# Patient Record
Sex: Female | Born: 1935 | Race: White | Hispanic: No | Smoking: Never smoker
Health system: Southern US, Community
[De-identification: ages and names within clinical notes are randomized; demographics above are authoritative.]

## PROBLEM LIST (undated history)

## (undated) DIAGNOSIS — G20 Parkinson's disease: Secondary | ICD-10-CM

## (undated) DIAGNOSIS — G20A1 Parkinson's disease without dyskinesia, without mention of fluctuations: Secondary | ICD-10-CM

## (undated) DIAGNOSIS — I10 Essential (primary) hypertension: Secondary | ICD-10-CM

## (undated) DIAGNOSIS — F419 Anxiety disorder, unspecified: Secondary | ICD-10-CM

## (undated) HISTORY — PX: APPENDECTOMY: SHX54

## (undated) HISTORY — PX: ABDOMINAL HYSTERECTOMY: SHX81

## (undated) HISTORY — PX: ELBOW FRACTURE SURGERY: SHX616

## (undated) HISTORY — PX: PACEMAKER IMPLANT: EP1218

## (undated) HISTORY — PX: EYE SURGERY: SHX253

---

## 2018-03-02 DIAGNOSIS — R300 Dysuria: Secondary | ICD-10-CM | POA: Diagnosis not present

## 2018-03-02 DIAGNOSIS — N390 Urinary tract infection, site not specified: Secondary | ICD-10-CM | POA: Diagnosis not present

## 2018-03-28 DIAGNOSIS — Z950 Presence of cardiac pacemaker: Secondary | ICD-10-CM | POA: Diagnosis not present

## 2018-03-28 DIAGNOSIS — N390 Urinary tract infection, site not specified: Secondary | ICD-10-CM | POA: Diagnosis not present

## 2018-03-28 DIAGNOSIS — A499 Bacterial infection, unspecified: Secondary | ICD-10-CM | POA: Diagnosis not present

## 2018-03-28 DIAGNOSIS — R5383 Other fatigue: Secondary | ICD-10-CM | POA: Diagnosis not present

## 2018-03-28 DIAGNOSIS — R300 Dysuria: Secondary | ICD-10-CM | POA: Diagnosis not present

## 2018-03-28 DIAGNOSIS — R5381 Other malaise: Secondary | ICD-10-CM | POA: Diagnosis not present

## 2018-04-04 DIAGNOSIS — Z950 Presence of cardiac pacemaker: Secondary | ICD-10-CM | POA: Diagnosis not present

## 2018-04-04 DIAGNOSIS — I10 Essential (primary) hypertension: Secondary | ICD-10-CM | POA: Diagnosis not present

## 2018-04-17 DIAGNOSIS — Z950 Presence of cardiac pacemaker: Secondary | ICD-10-CM | POA: Diagnosis not present

## 2018-04-17 DIAGNOSIS — I10 Essential (primary) hypertension: Secondary | ICD-10-CM | POA: Diagnosis not present

## 2018-04-17 DIAGNOSIS — R0789 Other chest pain: Secondary | ICD-10-CM | POA: Diagnosis not present

## 2018-04-17 DIAGNOSIS — R079 Chest pain, unspecified: Secondary | ICD-10-CM | POA: Diagnosis not present

## 2018-05-01 DIAGNOSIS — Z7689 Persons encountering health services in other specified circumstances: Secondary | ICD-10-CM | POA: Diagnosis not present

## 2018-05-01 DIAGNOSIS — I10 Essential (primary) hypertension: Secondary | ICD-10-CM | POA: Diagnosis not present

## 2018-05-01 DIAGNOSIS — R5383 Other fatigue: Secondary | ICD-10-CM | POA: Diagnosis not present

## 2018-05-01 DIAGNOSIS — M810 Age-related osteoporosis without current pathological fracture: Secondary | ICD-10-CM | POA: Diagnosis not present

## 2018-05-01 DIAGNOSIS — R300 Dysuria: Secondary | ICD-10-CM | POA: Diagnosis not present

## 2018-05-01 DIAGNOSIS — R2689 Other abnormalities of gait and mobility: Secondary | ICD-10-CM | POA: Diagnosis not present

## 2018-05-01 DIAGNOSIS — Z8781 Personal history of (healed) traumatic fracture: Secondary | ICD-10-CM | POA: Diagnosis not present

## 2018-05-01 DIAGNOSIS — Z950 Presence of cardiac pacemaker: Secondary | ICD-10-CM | POA: Diagnosis not present

## 2018-05-01 DIAGNOSIS — G629 Polyneuropathy, unspecified: Secondary | ICD-10-CM | POA: Diagnosis not present

## 2018-05-01 DIAGNOSIS — R413 Other amnesia: Secondary | ICD-10-CM | POA: Diagnosis not present

## 2018-05-01 DIAGNOSIS — R251 Tremor, unspecified: Secondary | ICD-10-CM | POA: Diagnosis not present

## 2018-05-16 DIAGNOSIS — Z950 Presence of cardiac pacemaker: Secondary | ICD-10-CM | POA: Diagnosis not present

## 2018-05-16 DIAGNOSIS — R0789 Other chest pain: Secondary | ICD-10-CM | POA: Diagnosis not present

## 2018-05-16 DIAGNOSIS — I10 Essential (primary) hypertension: Secondary | ICD-10-CM | POA: Diagnosis not present

## 2018-05-31 DIAGNOSIS — R251 Tremor, unspecified: Secondary | ICD-10-CM | POA: Diagnosis not present

## 2018-05-31 DIAGNOSIS — R413 Other amnesia: Secondary | ICD-10-CM | POA: Diagnosis not present

## 2018-05-31 DIAGNOSIS — R2689 Other abnormalities of gait and mobility: Secondary | ICD-10-CM | POA: Diagnosis not present

## 2018-05-31 DIAGNOSIS — I10 Essential (primary) hypertension: Secondary | ICD-10-CM | POA: Diagnosis not present

## 2018-05-31 DIAGNOSIS — N390 Urinary tract infection, site not specified: Secondary | ICD-10-CM | POA: Diagnosis not present

## 2018-06-22 ENCOUNTER — Encounter: Payer: Self-pay | Admitting: Emergency Medicine

## 2018-06-22 ENCOUNTER — Other Ambulatory Visit: Payer: Self-pay

## 2018-06-22 ENCOUNTER — Emergency Department
Admission: EM | Admit: 2018-06-22 | Discharge: 2018-06-22 | Disposition: A | Discharge status: Home / Self Care | Payer: PPO | Attending: Student in an Organized Health Care Education/Training Program | Admitting: Student in an Organized Health Care Education/Training Program

## 2018-06-22 ENCOUNTER — Emergency Department: Payer: PPO

## 2018-06-22 DIAGNOSIS — Z950 Presence of cardiac pacemaker: Secondary | ICD-10-CM | POA: Diagnosis not present

## 2018-06-22 DIAGNOSIS — R079 Chest pain, unspecified: Secondary | ICD-10-CM | POA: Insufficient documentation

## 2018-06-22 DIAGNOSIS — J984 Other disorders of lung: Secondary | ICD-10-CM | POA: Diagnosis not present

## 2018-06-22 DIAGNOSIS — R0789 Other chest pain: Secondary | ICD-10-CM | POA: Diagnosis not present

## 2018-06-22 DIAGNOSIS — I10 Essential (primary) hypertension: Secondary | ICD-10-CM | POA: Insufficient documentation

## 2018-06-22 HISTORY — DX: Essential (primary) hypertension: I10

## 2018-06-22 LAB — BASIC METABOLIC PANEL
Anion gap: 9 (ref 5–15)
BUN: 17 mg/dL (ref 8–23)
CO2: 25 mmol/L (ref 22–32)
Calcium: 9.3 mg/dL (ref 8.9–10.3)
Chloride: 100 mmol/L (ref 98–111)
Creatinine, Ser: 0.62 mg/dL (ref 0.44–1.00)
GFR calc non Af Amer: >60 mL/min (ref >60)
Glucose, Bld: 102 mg/dL — ABNORMAL HIGH (ref 70–99)
Potassium: 3.8 mmol/L (ref 3.5–5.1)
Sodium: 134 mmol/L — ABNORMAL LOW (ref 135–145)

## 2018-06-22 LAB — CBC
HCT: 39.7 % (ref 36.0–46.0)
Hemoglobin: 13 g/dL (ref 12.0–15.0)
MCH: 30.1 pg (ref 26.0–34.0)
MCHC: 32.7 g/dL (ref 30.0–36.0)
MCV: 91.9 fL (ref 80.0–100.0)
PLATELETS: 202 10*3/uL (ref 150–400)
RBC: 4.32 MIL/uL (ref 3.87–5.11)
RDW: 12.7 % (ref 11.5–15.5)
WBC: 8.7 10*3/uL (ref 4.0–10.5)
nRBC: 0 % (ref 0.0–0.2)

## 2018-06-22 LAB — TROPONIN I
Troponin I: <0.03 ng/mL (ref <0.03)
Troponin I: <0.03 ng/mL (ref <0.03)

## 2018-06-22 MED ORDER — ASPIRIN 81 MG PO CHEW
324.0000 mg | CHEWABLE_TABLET | Freq: Once | ORAL | Status: AC
  Administered 2018-06-22: 324 mg via ORAL
  Filled 2018-06-22: qty 4

## 2018-06-22 MED ORDER — OXAZEPAM 15 MG PO CAPS: 10.0000 mg | ORAL_CAPSULE | Freq: Once | ORAL | Status: DC

## 2018-06-22 MED ORDER — LORAZEPAM 1 MG PO TABS
1.0000 mg | ORAL_TABLET | Freq: Once | ORAL | Status: AC
  Administered 2018-06-22: 1 mg via ORAL
  Filled 2018-06-22: qty 1

## 2018-06-22 MED ORDER — HYDRALAZINE HCL 10 MG PO TABS
20.0000 mg | ORAL_TABLET | Freq: Once | ORAL | Status: AC
  Administered 2018-06-22: 20 mg via ORAL
  Filled 2018-06-22 (×2): qty 2

## 2018-06-22 MED ORDER — NITROGLYCERIN 0.4 MG SL SUBL: 0.4000 mg | SUBLINGUAL_TABLET | SUBLINGUAL | Status: DC | PRN

## 2018-06-22 NOTE — ED Notes (Signed)
Pt assisted to toilet. Wet depends changed.

## 2018-06-22 NOTE — ED Triage Notes (Signed)
Pt to ED from home c/o left intermittent chest tightness that started this morning.  Denies SOB, states some nausea but no vomiting.  Pt chest rise even and unlabored, skin warm and dry, in NAD at this time.

## 2018-06-22 NOTE — Discharge Instructions (Signed)
Please follow-up with cardiology and your primary care physician.  Return immediately for any worsening symptoms for any questions or any concerns.

## 2018-06-22 NOTE — ED Notes (Signed)
Pt presents to ED from home with c/c of L side chest pressure beginning this AM. See triage note. Pt has cardiac Hx with A/V pacemaker implanted. Pt A&O x4, skin diaphoretic, Pt states that is normal for her. Pt resting comfortably.

## 2018-06-22 NOTE — ED Provider Notes (Signed)
[facility] Emergency Department Provider Note    First MD Initiated Contact with Patient 06/22/18 1610     (approximate)  I have reviewed the triage vital signs and the nursing notes.   HISTORY  Chief Complaint Chest Pain    HPI Kara Levine is a 84 y.o. female with a history of hypertension as well as pacemaker for tachybradycardia syndrome presents to the ER with midsternal squeezing chest pain and pressure that started roughly 30 to 45 minutes prior to arrival to the ER.  States that she was sitting down and just wrapping silverware.  States that she also felt sweaty.  Has not been able to tolerate recent stress test due to severe labile blood pressures stating that she will go from hypotension to severe hypertension intermittently.  She denies any chest pain or shortness of breath at this time.  No fevers.  States that she has being evaluated for Lewy body dementia.  Does have a history of anxiety but this feels very different.  Does not have any pain when she takes a deep breath.  No pain shooting or tearing through to her back.  Denies any nausea or vomiting at this time but did have some nausea associated with the pain.    Past Medical History:  Diagnosis Date  . Hypertension    History reviewed. No pertinent family history. Past Surgical History:  Procedure Laterality Date  . ABDOMINAL HYSTERECTOMY    . APPENDECTOMY    . ELBOW FRACTURE SURGERY Bilateral   . EYE SURGERY    . PACEMAKER IMPLANT     There are no active problems to display for this patient.     Prior to Admission medications   Medication Sig Start Date End Date Taking? Authorizing Provider  bisoprolol (ZEBETA) 5 MG tablet Take 5-10 mg by mouth 2 (two) times daily.    Yes [provider]  hydrALAZINE (APRESOLINE) 10 MG tablet Take 10-20 mg by mouth daily as needed (BP <160).   Yes [provider]  oxazepam (SERAX) 10 MG capsule Take 10 mg by mouth daily  as needed for anxiety (raised blood pressure).   Yes [provider]    Allergies Codeine; Keflex [cephalexin]; Norco [hydrocodone-acetaminophen]; and Shellfish allergy    Social History Social History   Tobacco Use  . Smoking status: Never Smoker  . Smokeless tobacco: Never Used  Substance Use Topics  . Alcohol use: Never    Frequency: Never  . Drug use: Never    Review of Systems Patient denies headaches, rhinorrhea, blurry vision, numbness, shortness of breath, chest pain, edema, cough, abdominal pain, nausea, vomiting, diarrhea, dysuria, fevers, rashes or hallucinations unless otherwise stated above in HPI. ____________________________________________   PHYSICAL EXAM:  VITAL SIGNS: Vitals:   06/22/18 1827 06/22/18 1930  BP: (!) 222/89 (!) 198/86  Pulse: 82 (!) 56  Resp: 15 14  Temp:    SpO2: 96% 99%    Constitutional: Alert and oriented.  Eyes: Conjunctivae are normal.  Head: Atraumatic. Nose: No congestion/rhinnorhea. Mouth/Throat: Mucous membranes are moist.   Neck: No stridor. Painless ROM.  Cardiovascular: Normal rate, regular rhythm. Grossly normal heart sounds.  Good peripheral circulation. Respiratory: Normal respiratory effort.  No retractions. Lungs CTAB. Gastrointestinal: Soft and nontender. No distention. No abdominal bruits. No CVA tenderness. Genitourinary:  Musculoskeletal: No lower extremity tenderness nor edema.  No joint effusions. Neurologic:  Normal speech and language. No gross focal neurologic deficits are appreciated. No facial droop Skin:  Skin is warm, dry and intact. No rash noted. Psychiatric: Mood and affect are normal. Speech and behavior are normal.  ____________________________________________   LABS (all labs ordered are listed, but only abnormal results are displayed)  Results for orders placed or performed during the hospital encounter of 06/22/18 (from the past 24 hour(s))  Basic metabolic panel     Status:  Abnormal   Collection Time: 06/22/18  1:21 PM  Result Value Ref Range   Sodium 134 (L) 135 - 145 mmol/L   Potassium 3.8 3.5 - 5.1 mmol/L   Chloride 100 98 - 111 mmol/L   CO2 25 22 - 32 mmol/L   Glucose, Bld 102 (H) 70 - 99 mg/dL   BUN 17 8 - 23 mg/dL   Creatinine, Ser 0.62 0.44 - 1.00 mg/dL   Calcium 9.3 8.9 - 10.3 mg/dL   GFR calc non Af Amer >60 >60 mL/min   GFR calc Af Amer >60 >60 mL/min   Anion gap 9 5 - 15  CBC     Status: None   Collection Time: 06/22/18  1:21 PM  Result Value Ref Range   WBC 8.7 4.0 - 10.5 K/uL   RBC 4.32 3.87 - 5.11 MIL/uL   Hemoglobin 13.0 12.0 - 15.0 g/dL   HCT 39.7 36.0 - 46.0 %   MCV 91.9 80.0 - 100.0 fL   MCH 30.1 26.0 - 34.0 pg   MCHC 32.7 30.0 - 36.0 g/dL   RDW 12.7 11.5 - 15.5 %   Platelets 202 150 - 400 K/uL   nRBC 0.0 0.0 - 0.2 %  Troponin I - ONCE - STAT     Status: None   Collection Time: 06/22/18  1:21 PM  Result Value Ref Range   Troponin I <0.03 <0.03 ng/mL  Troponin I - Once-Timed     Status: None   Collection Time: 06/22/18  5:33 PM  Result Value Ref Range   Troponin I <0.03 <0.03 ng/mL   ____________________________________________  EKG My review and personal interpretation at Time: 13:14   Indication: chest pain  Rate: 70  Rhythm: sinus Axis: normal  Other: nonspeicific st abn, rbbb ____________________________________________  RADIOLOGY  I personally reviewed all radiographic images ordered to evaluate for the above acute complaints and reviewed radiology reports and findings.  These findings were personally discussed with the patient.  Please see medical record for radiology report.  ____________________________________________   PROCEDURES  Procedure(s) performed:  Procedures    Critical Care performed: no ____________________________________________   INITIAL IMPRESSION / ASSESSMENT AND PLAN / ED COURSE  Pertinent labs & imaging results that were available during my care of the patient were reviewed by me  and considered in my medical decision making (see chart for details).   DDX: ACS, pericarditis, esophagitis, boerhaaves, pe, dissection, pna, bronchitis, costochondritis   Kara Levine is a 84 y.o. who presents to the ED with presents the ER with hypertension and chest discomfort as described above.  She is nontoxic well-appearing but does appear mildly anxious.  EKG is unchanged from previous.  She is currently chest pain-free.  Does not seem clinically consistent with dissection or PE.  Will further stratify with serial enzymes.  Discussed my concern regarding her age and risk factors for ACS but patient is reluctant to be admitted to the hospital despite being aware of risks and delaying diagnosis of cardiac disease.  The patient will be placed on continuous pulse oximetry and telemetry for monitoring.  Laboratory evaluation will be  sent to evaluate for the above complaints.     Clinical Course as of Jun 23 1939  Fri Jun 22, 2018  1833 Relayed my concern regarding the patient's elevated blood pressure but she states that she is always elevated when she is in the hospital.  She is currently chest pain-free.  I recommended admission the hospital for further blood pressure management as well as cardiac evaluation.  Patient states that she would like for her family to return so we can discuss with them further.   [PR]  1935 Patient's blood pressure improved after Ativan.  Requesting discharge home.   [PR]    Clinical Course User Index [PR] Merlyn Lot, MD     As part of my medical decision making, I reviewed the following data within the [facility] notes reviewed and incorporated, Labs reviewed, notes from prior ED visits.   ____________________________________________   FINAL CLINICAL IMPRESSION(S) / ED DIAGNOSES  Final diagnoses:  Chest pain, unspecified type  Hypertension, unspecified type      NEW MEDICATIONS STARTED DURING THIS  VISIT:  New Prescriptions   No medications on file     Note:  This document was prepared using Dragon voice recognition software and may include unintentional dictation errors.    Merlyn Lot, MD 06/22/18 [phone]

## 2018-06-25 DIAGNOSIS — R300 Dysuria: Secondary | ICD-10-CM | POA: Diagnosis not present

## 2018-07-24 DIAGNOSIS — G20 Parkinson's disease: Secondary | ICD-10-CM | POA: Diagnosis not present

## 2018-08-07 DIAGNOSIS — G20 Parkinson's disease: Secondary | ICD-10-CM | POA: Insufficient documentation

## 2018-08-14 DIAGNOSIS — Z96698 Presence of other orthopedic joint implants: Secondary | ICD-10-CM | POA: Diagnosis not present

## 2018-08-14 DIAGNOSIS — F0280 Dementia in other diseases classified elsewhere without behavioral disturbance: Secondary | ICD-10-CM | POA: Diagnosis not present

## 2018-08-14 DIAGNOSIS — I10 Essential (primary) hypertension: Secondary | ICD-10-CM | POA: Diagnosis not present

## 2018-08-14 DIAGNOSIS — G20 Parkinson's disease: Secondary | ICD-10-CM | POA: Diagnosis not present

## 2018-08-14 DIAGNOSIS — Z9181 History of falling: Secondary | ICD-10-CM | POA: Diagnosis not present

## 2018-08-14 DIAGNOSIS — K219 Gastro-esophageal reflux disease without esophagitis: Secondary | ICD-10-CM | POA: Diagnosis not present

## 2018-08-14 DIAGNOSIS — F329 Major depressive disorder, single episode, unspecified: Secondary | ICD-10-CM | POA: Diagnosis not present

## 2018-08-14 DIAGNOSIS — Z90710 Acquired absence of both cervix and uterus: Secondary | ICD-10-CM | POA: Diagnosis not present

## 2018-08-14 DIAGNOSIS — Z6820 Body mass index (BMI) 20.0-20.9, adult: Secondary | ICD-10-CM | POA: Diagnosis not present

## 2018-08-14 DIAGNOSIS — J45909 Unspecified asthma, uncomplicated: Secondary | ICD-10-CM | POA: Diagnosis not present

## 2018-08-14 DIAGNOSIS — M810 Age-related osteoporosis without current pathological fracture: Secondary | ICD-10-CM | POA: Diagnosis not present

## 2018-08-14 DIAGNOSIS — E785 Hyperlipidemia, unspecified: Secondary | ICD-10-CM | POA: Diagnosis not present

## 2018-08-14 DIAGNOSIS — Z950 Presence of cardiac pacemaker: Secondary | ICD-10-CM | POA: Diagnosis not present

## 2018-08-14 DIAGNOSIS — G629 Polyneuropathy, unspecified: Secondary | ICD-10-CM | POA: Diagnosis not present

## 2018-08-16 DIAGNOSIS — M779 Enthesopathy, unspecified: Secondary | ICD-10-CM | POA: Diagnosis not present

## 2018-08-16 DIAGNOSIS — M2042 Other hammer toe(s) (acquired), left foot: Secondary | ICD-10-CM | POA: Diagnosis not present

## 2018-08-16 DIAGNOSIS — M2041 Other hammer toe(s) (acquired), right foot: Secondary | ICD-10-CM | POA: Diagnosis not present

## 2018-08-16 DIAGNOSIS — M2011 Hallux valgus (acquired), right foot: Secondary | ICD-10-CM | POA: Diagnosis not present

## 2018-08-16 DIAGNOSIS — Q6689 Other  specified congenital deformities of feet: Secondary | ICD-10-CM | POA: Diagnosis not present

## 2018-08-16 DIAGNOSIS — M2012 Hallux valgus (acquired), left foot: Secondary | ICD-10-CM | POA: Diagnosis not present

## 2018-09-13 DIAGNOSIS — Z9181 History of falling: Secondary | ICD-10-CM | POA: Diagnosis not present

## 2018-09-13 DIAGNOSIS — G629 Polyneuropathy, unspecified: Secondary | ICD-10-CM | POA: Diagnosis not present

## 2018-09-13 DIAGNOSIS — E785 Hyperlipidemia, unspecified: Secondary | ICD-10-CM | POA: Diagnosis not present

## 2018-09-13 DIAGNOSIS — J45909 Unspecified asthma, uncomplicated: Secondary | ICD-10-CM | POA: Diagnosis not present

## 2018-09-13 DIAGNOSIS — G20 Parkinson's disease: Secondary | ICD-10-CM | POA: Diagnosis not present

## 2018-09-13 DIAGNOSIS — M810 Age-related osteoporosis without current pathological fracture: Secondary | ICD-10-CM | POA: Diagnosis not present

## 2018-09-13 DIAGNOSIS — I10 Essential (primary) hypertension: Secondary | ICD-10-CM | POA: Diagnosis not present

## 2018-09-13 DIAGNOSIS — Z90710 Acquired absence of both cervix and uterus: Secondary | ICD-10-CM | POA: Diagnosis not present

## 2018-09-13 DIAGNOSIS — F329 Major depressive disorder, single episode, unspecified: Secondary | ICD-10-CM | POA: Diagnosis not present

## 2018-09-13 DIAGNOSIS — Z6820 Body mass index (BMI) 20.0-20.9, adult: Secondary | ICD-10-CM | POA: Diagnosis not present

## 2018-09-13 DIAGNOSIS — Z96698 Presence of other orthopedic joint implants: Secondary | ICD-10-CM | POA: Diagnosis not present

## 2018-09-13 DIAGNOSIS — F0280 Dementia in other diseases classified elsewhere without behavioral disturbance: Secondary | ICD-10-CM | POA: Diagnosis not present

## 2018-09-13 DIAGNOSIS — Z950 Presence of cardiac pacemaker: Secondary | ICD-10-CM | POA: Diagnosis not present

## 2018-09-13 DIAGNOSIS — K219 Gastro-esophageal reflux disease without esophagitis: Secondary | ICD-10-CM | POA: Diagnosis not present

## 2018-10-04 DIAGNOSIS — G20 Parkinson's disease: Secondary | ICD-10-CM | POA: Diagnosis not present

## 2018-10-04 DIAGNOSIS — D229 Melanocytic nevi, unspecified: Secondary | ICD-10-CM | POA: Diagnosis not present

## 2018-10-04 DIAGNOSIS — R251 Tremor, unspecified: Secondary | ICD-10-CM | POA: Diagnosis not present

## 2018-10-04 DIAGNOSIS — F419 Anxiety disorder, unspecified: Secondary | ICD-10-CM | POA: Diagnosis not present

## 2018-10-04 DIAGNOSIS — R002 Palpitations: Secondary | ICD-10-CM | POA: Diagnosis not present

## 2018-10-04 DIAGNOSIS — I10 Essential (primary) hypertension: Secondary | ICD-10-CM | POA: Diagnosis not present

## 2018-10-04 DIAGNOSIS — M810 Age-related osteoporosis without current pathological fracture: Secondary | ICD-10-CM | POA: Diagnosis not present

## 2018-10-04 DIAGNOSIS — R413 Other amnesia: Secondary | ICD-10-CM | POA: Diagnosis not present

## 2018-10-09 DIAGNOSIS — G20 Parkinson's disease: Secondary | ICD-10-CM | POA: Diagnosis not present

## 2018-10-10 DIAGNOSIS — I10 Essential (primary) hypertension: Secondary | ICD-10-CM | POA: Diagnosis not present

## 2018-10-10 DIAGNOSIS — D692 Other nonthrombocytopenic purpura: Secondary | ICD-10-CM | POA: Diagnosis not present

## 2018-10-10 DIAGNOSIS — R002 Palpitations: Secondary | ICD-10-CM | POA: Insufficient documentation

## 2018-10-10 DIAGNOSIS — L814 Other melanin hyperpigmentation: Secondary | ICD-10-CM | POA: Diagnosis not present

## 2018-10-10 DIAGNOSIS — E1169 Type 2 diabetes mellitus with other specified complication: Secondary | ICD-10-CM | POA: Diagnosis not present

## 2018-10-10 DIAGNOSIS — X32XXXA Exposure to sunlight, initial encounter: Secondary | ICD-10-CM | POA: Diagnosis not present

## 2018-10-10 DIAGNOSIS — L218 Other seborrheic dermatitis: Secondary | ICD-10-CM | POA: Diagnosis not present

## 2018-10-10 DIAGNOSIS — Z950 Presence of cardiac pacemaker: Secondary | ICD-10-CM | POA: Diagnosis not present

## 2018-10-16 DIAGNOSIS — I4891 Unspecified atrial fibrillation: Secondary | ICD-10-CM | POA: Diagnosis not present

## 2018-10-24 DIAGNOSIS — G20 Parkinson's disease: Secondary | ICD-10-CM | POA: Diagnosis not present

## 2018-10-24 DIAGNOSIS — R2689 Other abnormalities of gait and mobility: Secondary | ICD-10-CM | POA: Diagnosis not present

## 2018-10-24 DIAGNOSIS — R4189 Other symptoms and signs involving cognitive functions and awareness: Secondary | ICD-10-CM | POA: Diagnosis not present

## 2018-10-24 DIAGNOSIS — F419 Anxiety disorder, unspecified: Secondary | ICD-10-CM | POA: Diagnosis not present

## 2018-10-30 DIAGNOSIS — I480 Paroxysmal atrial fibrillation: Secondary | ICD-10-CM | POA: Insufficient documentation

## 2018-10-31 DIAGNOSIS — F419 Anxiety disorder, unspecified: Secondary | ICD-10-CM | POA: Insufficient documentation

## 2018-10-31 DIAGNOSIS — I10 Essential (primary) hypertension: Secondary | ICD-10-CM | POA: Diagnosis not present

## 2018-10-31 DIAGNOSIS — R002 Palpitations: Secondary | ICD-10-CM | POA: Diagnosis not present

## 2018-10-31 DIAGNOSIS — R0789 Other chest pain: Secondary | ICD-10-CM | POA: Diagnosis not present

## 2018-11-28 DIAGNOSIS — R238 Other skin changes: Secondary | ICD-10-CM | POA: Diagnosis not present

## 2018-11-28 DIAGNOSIS — R202 Paresthesia of skin: Secondary | ICD-10-CM | POA: Diagnosis not present

## 2018-11-28 DIAGNOSIS — R5383 Other fatigue: Secondary | ICD-10-CM | POA: Diagnosis not present

## 2018-11-28 DIAGNOSIS — L539 Erythematous condition, unspecified: Secondary | ICD-10-CM | POA: Diagnosis not present

## 2019-01-30 DIAGNOSIS — F419 Anxiety disorder, unspecified: Secondary | ICD-10-CM | POA: Diagnosis not present

## 2019-01-30 DIAGNOSIS — Z950 Presence of cardiac pacemaker: Secondary | ICD-10-CM | POA: Diagnosis not present

## 2019-01-30 DIAGNOSIS — R002 Palpitations: Secondary | ICD-10-CM | POA: Diagnosis not present

## 2019-02-05 DIAGNOSIS — R300 Dysuria: Secondary | ICD-10-CM | POA: Diagnosis not present

## 2019-02-05 DIAGNOSIS — R350 Frequency of micturition: Secondary | ICD-10-CM | POA: Diagnosis not present

## 2019-02-22 DIAGNOSIS — G20 Parkinson's disease: Secondary | ICD-10-CM | POA: Diagnosis not present

## 2019-03-01 DIAGNOSIS — M6281 Muscle weakness (generalized): Secondary | ICD-10-CM | POA: Diagnosis not present

## 2019-03-01 DIAGNOSIS — R2689 Other abnormalities of gait and mobility: Secondary | ICD-10-CM | POA: Diagnosis not present

## 2019-03-01 DIAGNOSIS — G20 Parkinson's disease: Secondary | ICD-10-CM | POA: Diagnosis not present

## 2019-03-04 DIAGNOSIS — R2689 Other abnormalities of gait and mobility: Secondary | ICD-10-CM | POA: Diagnosis not present

## 2019-03-04 DIAGNOSIS — G20 Parkinson's disease: Secondary | ICD-10-CM | POA: Diagnosis not present

## 2019-03-04 DIAGNOSIS — M6281 Muscle weakness (generalized): Secondary | ICD-10-CM | POA: Diagnosis not present

## 2019-03-06 DIAGNOSIS — M6281 Muscle weakness (generalized): Secondary | ICD-10-CM | POA: Diagnosis not present

## 2019-03-06 DIAGNOSIS — G20 Parkinson's disease: Secondary | ICD-10-CM | POA: Diagnosis not present

## 2019-03-06 DIAGNOSIS — R2689 Other abnormalities of gait and mobility: Secondary | ICD-10-CM | POA: Diagnosis not present

## 2019-03-07 DIAGNOSIS — R251 Tremor, unspecified: Secondary | ICD-10-CM | POA: Diagnosis not present

## 2019-03-07 DIAGNOSIS — Z87898 Personal history of other specified conditions: Secondary | ICD-10-CM | POA: Diagnosis not present

## 2019-03-07 DIAGNOSIS — G20 Parkinson's disease: Secondary | ICD-10-CM | POA: Diagnosis not present

## 2019-03-07 DIAGNOSIS — M810 Age-related osteoporosis without current pathological fracture: Secondary | ICD-10-CM | POA: Diagnosis not present

## 2019-03-07 DIAGNOSIS — N390 Urinary tract infection, site not specified: Secondary | ICD-10-CM | POA: Diagnosis not present

## 2019-03-07 DIAGNOSIS — R002 Palpitations: Secondary | ICD-10-CM | POA: Diagnosis not present

## 2019-03-07 DIAGNOSIS — Z0000 Encounter for general adult medical examination without abnormal findings: Secondary | ICD-10-CM | POA: Diagnosis not present

## 2019-03-07 DIAGNOSIS — I10 Essential (primary) hypertension: Secondary | ICD-10-CM | POA: Diagnosis not present

## 2019-03-07 DIAGNOSIS — K219 Gastro-esophageal reflux disease without esophagitis: Secondary | ICD-10-CM | POA: Diagnosis not present

## 2019-03-07 DIAGNOSIS — F419 Anxiety disorder, unspecified: Secondary | ICD-10-CM | POA: Diagnosis not present

## 2019-03-07 DIAGNOSIS — I480 Paroxysmal atrial fibrillation: Secondary | ICD-10-CM | POA: Diagnosis not present

## 2019-03-08 DIAGNOSIS — R2689 Other abnormalities of gait and mobility: Secondary | ICD-10-CM | POA: Diagnosis not present

## 2019-03-08 DIAGNOSIS — G20 Parkinson's disease: Secondary | ICD-10-CM | POA: Diagnosis not present

## 2019-03-08 DIAGNOSIS — M6281 Muscle weakness (generalized): Secondary | ICD-10-CM | POA: Diagnosis not present

## 2019-03-11 DIAGNOSIS — G20 Parkinson's disease: Secondary | ICD-10-CM | POA: Diagnosis not present

## 2019-03-11 DIAGNOSIS — M6281 Muscle weakness (generalized): Secondary | ICD-10-CM | POA: Diagnosis not present

## 2019-03-11 DIAGNOSIS — R2689 Other abnormalities of gait and mobility: Secondary | ICD-10-CM | POA: Diagnosis not present

## 2019-03-13 DIAGNOSIS — R279 Unspecified lack of coordination: Secondary | ICD-10-CM | POA: Diagnosis not present

## 2019-03-13 DIAGNOSIS — R2689 Other abnormalities of gait and mobility: Secondary | ICD-10-CM | POA: Diagnosis not present

## 2019-03-13 DIAGNOSIS — G20 Parkinson's disease: Secondary | ICD-10-CM | POA: Diagnosis not present

## 2019-03-13 DIAGNOSIS — R1312 Dysphagia, oropharyngeal phase: Secondary | ICD-10-CM | POA: Diagnosis not present

## 2019-03-13 DIAGNOSIS — M6281 Muscle weakness (generalized): Secondary | ICD-10-CM | POA: Diagnosis not present

## 2019-03-13 DIAGNOSIS — Z9181 History of falling: Secondary | ICD-10-CM | POA: Diagnosis not present

## 2019-03-15 DIAGNOSIS — M6281 Muscle weakness (generalized): Secondary | ICD-10-CM | POA: Diagnosis not present

## 2019-03-15 DIAGNOSIS — Z713 Dietary counseling and surveillance: Secondary | ICD-10-CM | POA: Diagnosis not present

## 2019-03-15 DIAGNOSIS — G20 Parkinson's disease: Secondary | ICD-10-CM | POA: Diagnosis not present

## 2019-03-15 DIAGNOSIS — F419 Anxiety disorder, unspecified: Secondary | ICD-10-CM | POA: Diagnosis not present

## 2019-03-15 DIAGNOSIS — R2689 Other abnormalities of gait and mobility: Secondary | ICD-10-CM | POA: Diagnosis not present

## 2019-03-15 DIAGNOSIS — R4189 Other symptoms and signs involving cognitive functions and awareness: Secondary | ICD-10-CM | POA: Diagnosis not present

## 2019-03-18 DIAGNOSIS — R2689 Other abnormalities of gait and mobility: Secondary | ICD-10-CM | POA: Diagnosis not present

## 2019-03-18 DIAGNOSIS — G20 Parkinson's disease: Secondary | ICD-10-CM | POA: Diagnosis not present

## 2019-03-18 DIAGNOSIS — Z9181 History of falling: Secondary | ICD-10-CM | POA: Diagnosis not present

## 2019-03-18 DIAGNOSIS — R279 Unspecified lack of coordination: Secondary | ICD-10-CM | POA: Diagnosis not present

## 2019-03-18 DIAGNOSIS — M6281 Muscle weakness (generalized): Secondary | ICD-10-CM | POA: Diagnosis not present

## 2019-03-18 DIAGNOSIS — R1312 Dysphagia, oropharyngeal phase: Secondary | ICD-10-CM | POA: Diagnosis not present

## 2019-03-20 DIAGNOSIS — R2689 Other abnormalities of gait and mobility: Secondary | ICD-10-CM | POA: Diagnosis not present

## 2019-03-20 DIAGNOSIS — G20 Parkinson's disease: Secondary | ICD-10-CM | POA: Diagnosis not present

## 2019-03-20 DIAGNOSIS — M6281 Muscle weakness (generalized): Secondary | ICD-10-CM | POA: Diagnosis not present

## 2019-03-20 DIAGNOSIS — R1312 Dysphagia, oropharyngeal phase: Secondary | ICD-10-CM | POA: Diagnosis not present

## 2019-03-22 DIAGNOSIS — R2689 Other abnormalities of gait and mobility: Secondary | ICD-10-CM | POA: Diagnosis not present

## 2019-03-22 DIAGNOSIS — G20 Parkinson's disease: Secondary | ICD-10-CM | POA: Diagnosis not present

## 2019-03-22 DIAGNOSIS — M6281 Muscle weakness (generalized): Secondary | ICD-10-CM | POA: Diagnosis not present

## 2019-03-25 DIAGNOSIS — R1312 Dysphagia, oropharyngeal phase: Secondary | ICD-10-CM | POA: Diagnosis not present

## 2019-03-25 DIAGNOSIS — G20 Parkinson's disease: Secondary | ICD-10-CM | POA: Diagnosis not present

## 2019-03-27 DIAGNOSIS — M6281 Muscle weakness (generalized): Secondary | ICD-10-CM | POA: Diagnosis not present

## 2019-03-27 DIAGNOSIS — Z9181 History of falling: Secondary | ICD-10-CM | POA: Diagnosis not present

## 2019-03-27 DIAGNOSIS — R2689 Other abnormalities of gait and mobility: Secondary | ICD-10-CM | POA: Diagnosis not present

## 2019-03-27 DIAGNOSIS — G20 Parkinson's disease: Secondary | ICD-10-CM | POA: Diagnosis not present

## 2019-03-27 DIAGNOSIS — R279 Unspecified lack of coordination: Secondary | ICD-10-CM | POA: Diagnosis not present

## 2019-03-29 DIAGNOSIS — G20 Parkinson's disease: Secondary | ICD-10-CM | POA: Diagnosis not present

## 2019-03-29 DIAGNOSIS — R2689 Other abnormalities of gait and mobility: Secondary | ICD-10-CM | POA: Diagnosis not present

## 2019-03-29 DIAGNOSIS — M6281 Muscle weakness (generalized): Secondary | ICD-10-CM | POA: Diagnosis not present

## 2019-04-01 DIAGNOSIS — G20 Parkinson's disease: Secondary | ICD-10-CM | POA: Diagnosis not present

## 2019-04-01 DIAGNOSIS — R2689 Other abnormalities of gait and mobility: Secondary | ICD-10-CM | POA: Diagnosis not present

## 2019-04-01 DIAGNOSIS — R1312 Dysphagia, oropharyngeal phase: Secondary | ICD-10-CM | POA: Diagnosis not present

## 2019-04-01 DIAGNOSIS — R490 Dysphonia: Secondary | ICD-10-CM | POA: Diagnosis not present

## 2019-04-01 DIAGNOSIS — R41841 Cognitive communication deficit: Secondary | ICD-10-CM | POA: Diagnosis not present

## 2019-04-01 DIAGNOSIS — M6281 Muscle weakness (generalized): Secondary | ICD-10-CM | POA: Diagnosis not present

## 2019-04-03 DIAGNOSIS — R279 Unspecified lack of coordination: Secondary | ICD-10-CM | POA: Diagnosis not present

## 2019-04-03 DIAGNOSIS — R2689 Other abnormalities of gait and mobility: Secondary | ICD-10-CM | POA: Diagnosis not present

## 2019-04-03 DIAGNOSIS — M6281 Muscle weakness (generalized): Secondary | ICD-10-CM | POA: Diagnosis not present

## 2019-04-03 DIAGNOSIS — Z9181 History of falling: Secondary | ICD-10-CM | POA: Diagnosis not present

## 2019-04-03 DIAGNOSIS — G20 Parkinson's disease: Secondary | ICD-10-CM | POA: Diagnosis not present

## 2019-04-08 DIAGNOSIS — R490 Dysphonia: Secondary | ICD-10-CM | POA: Diagnosis not present

## 2019-04-08 DIAGNOSIS — R1312 Dysphagia, oropharyngeal phase: Secondary | ICD-10-CM | POA: Diagnosis not present

## 2019-04-08 DIAGNOSIS — G20 Parkinson's disease: Secondary | ICD-10-CM | POA: Diagnosis not present

## 2019-04-08 DIAGNOSIS — R41841 Cognitive communication deficit: Secondary | ICD-10-CM | POA: Diagnosis not present

## 2019-04-10 DIAGNOSIS — Z9181 History of falling: Secondary | ICD-10-CM | POA: Diagnosis not present

## 2019-04-10 DIAGNOSIS — M6281 Muscle weakness (generalized): Secondary | ICD-10-CM | POA: Diagnosis not present

## 2019-04-10 DIAGNOSIS — R279 Unspecified lack of coordination: Secondary | ICD-10-CM | POA: Diagnosis not present

## 2019-04-10 DIAGNOSIS — G20 Parkinson's disease: Secondary | ICD-10-CM | POA: Diagnosis not present

## 2019-04-10 DIAGNOSIS — R2689 Other abnormalities of gait and mobility: Secondary | ICD-10-CM | POA: Diagnosis not present

## 2019-04-12 DIAGNOSIS — R279 Unspecified lack of coordination: Secondary | ICD-10-CM | POA: Diagnosis not present

## 2019-04-12 DIAGNOSIS — G20 Parkinson's disease: Secondary | ICD-10-CM | POA: Diagnosis not present

## 2019-04-12 DIAGNOSIS — R2689 Other abnormalities of gait and mobility: Secondary | ICD-10-CM | POA: Diagnosis not present

## 2019-04-12 DIAGNOSIS — M6281 Muscle weakness (generalized): Secondary | ICD-10-CM | POA: Diagnosis not present

## 2019-04-12 DIAGNOSIS — Z9181 History of falling: Secondary | ICD-10-CM | POA: Diagnosis not present

## 2019-04-15 DIAGNOSIS — R1312 Dysphagia, oropharyngeal phase: Secondary | ICD-10-CM | POA: Diagnosis not present

## 2019-04-15 DIAGNOSIS — R490 Dysphonia: Secondary | ICD-10-CM | POA: Diagnosis not present

## 2019-04-15 DIAGNOSIS — G20 Parkinson's disease: Secondary | ICD-10-CM | POA: Diagnosis not present

## 2019-04-15 DIAGNOSIS — R41841 Cognitive communication deficit: Secondary | ICD-10-CM | POA: Diagnosis not present

## 2019-04-17 DIAGNOSIS — Z9181 History of falling: Secondary | ICD-10-CM | POA: Diagnosis not present

## 2019-04-17 DIAGNOSIS — M6281 Muscle weakness (generalized): Secondary | ICD-10-CM | POA: Diagnosis not present

## 2019-04-17 DIAGNOSIS — R279 Unspecified lack of coordination: Secondary | ICD-10-CM | POA: Diagnosis not present

## 2019-04-17 DIAGNOSIS — G20 Parkinson's disease: Secondary | ICD-10-CM | POA: Diagnosis not present

## 2019-04-17 DIAGNOSIS — R2689 Other abnormalities of gait and mobility: Secondary | ICD-10-CM | POA: Diagnosis not present

## 2019-04-17 DIAGNOSIS — R41841 Cognitive communication deficit: Secondary | ICD-10-CM | POA: Diagnosis not present

## 2019-04-17 DIAGNOSIS — R490 Dysphonia: Secondary | ICD-10-CM | POA: Diagnosis not present

## 2019-04-17 DIAGNOSIS — R1312 Dysphagia, oropharyngeal phase: Secondary | ICD-10-CM | POA: Diagnosis not present

## 2019-04-18 ENCOUNTER — Ambulatory Visit: Payer: PPO | Admitting: Urology

## 2019-04-18 ENCOUNTER — Other Ambulatory Visit: Payer: Self-pay

## 2019-04-18 ENCOUNTER — Encounter: Payer: Self-pay | Admitting: Urology

## 2019-04-18 VITALS — BP 163/85 | HR 71 | Ht 64.0 in | Wt 130.0 lb

## 2019-04-18 DIAGNOSIS — N390 Urinary tract infection, site not specified: Secondary | ICD-10-CM

## 2019-04-18 DIAGNOSIS — N3281 Overactive bladder: Secondary | ICD-10-CM | POA: Diagnosis not present

## 2019-04-18 LAB — MICROSCOPIC EXAMINATION
RBC, Urine: NONE SEEN /hpf (ref 0–2)
WBC, UA: >30 /hpf — AB (ref 0–5)

## 2019-04-18 LAB — URINALYSIS, COMPLETE
Bilirubin, UA: NEGATIVE
Glucose, UA: NEGATIVE
Ketones, UA: NEGATIVE
Nitrite, UA: POSITIVE — AB
RBC, UA: NEGATIVE
Specific Gravity, UA: 1.02 (ref 1.005–1.030)
Urobilinogen, Ur: 0.2 mg/dL (ref 0.2–1.0)
pH, UA: 8.5 — ABNORMAL HIGH (ref 5.0–7.5)

## 2019-04-18 LAB — BLADDER SCAN AMB NON-IMAGING: Scan Result: 116

## 2019-04-18 MED ORDER — NITROFURANTOIN MONOHYD MACRO 100 MG PO CAPS: 100.0000 mg | ORAL_CAPSULE | Freq: Every day | ORAL | 0 refills | Status: DC

## 2019-04-18 MED ORDER — CIPROFLOXACIN HCL 500 MG PO TABS: 500.0000 mg | ORAL_TABLET | Freq: Two times a day (BID) | ORAL | 0 refills | Status: DC

## 2019-04-18 NOTE — Progress Notes (Signed)
04/18/19 11:18 AM   Anthony Sar [birth year] [id number]  Referring provider: Medicine, [facility] [address] [place],  [place] [id number]  CC: Recurrent UTIs and urinary symptoms  HPI: I saw Ms. Kara Levine in urology clinic in consultation for recurrent UTIs and urinary symptoms from Dr. Carrie Mew.  She is an 85 year old female with Parkinson's who recently moved to the area to be closer to family from [place].  She has had multiple Enterococcus UTIs over the last few months with documented culture positive UTIs in September, August, December, and November.  She has been on multiple courses of antibiotics which seem to temporarily improve her symptoms.  She also reports a feeling of incomplete emptying and difficulty urinating, as well as some urinary leakage.  She is a poor historian, however it sounds like she is having some leakage without being aware, as well as some urgency and urge incontinence.  There are no aggravating or alleviating factors.  Severity is moderate.  There is no prior imaging to review.  PVR 115 mL in clinic today.  Urinalysis concerning for infection with greater than 30 WBCs, 0 RBCs, many bacteria, nitrite positive.  PMH: Past Medical History:  Diagnosis Date  . Hypertension     Surgical History: Past Surgical History:  Procedure Laterality Date  . ABDOMINAL HYSTERECTOMY    . APPENDECTOMY    . ELBOW FRACTURE SURGERY Bilateral   . EYE SURGERY    . PACEMAKER IMPLANT      Allergies:  Allergies  Allergen Reactions  . Codeine   . Keflex [Cephalexin]   . Norco [Hydrocodone-Acetaminophen]   . Shellfish Allergy Nausea Only    Patient stated it was like food poison     Family History: History reviewed. No pertinent family history.  Social History:  reports that she has never smoked. She has never used smokeless tobacco. She reports that she does not drink alcohol or use drugs.  ROS: Please see flowsheet from today's date  for complete review of systems.  Physical Exam: BP (!) 163/85   Pulse 71   Ht 5\' 4"  (1.626 m)   Wt 130 lb (59 kg)   BMI 22.31 kg/m    Constitutional:  Alert and oriented, No acute distress. Cardiovascular: No clubbing, cyanosis, or edema. Respiratory: Normal respiratory effort, no increased work of breathing. GI: Abdomen is soft, nontender, nondistended, no abdominal masses GU: No CVA tenderness Lymph: No cervical or inguinal lymphadenopathy. Skin: No rashes, bruises or suspicious lesions. Neurologic: Grossly intact, no focal deficits, moving all 4 extremities. Psychiatric: Normal mood and affect.  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  We discussed the evaluation and treatment of patients with recurrent UTIs at length.  We specifically discussed the differences between asymptomatic bacteriuria and true urinary tract infection.  We discussed the AUA definition of recurrent UTI of at least 2 culture proven symptomatic acute cystitis episodes in a 48-month period, or 3 within a 1 year period.  We discussed the importance of culture directed antibiotic treatment, and antibiotic stewardship.  First-line therapy includes nitrofurantoin(5 days), Bactrim(3 days), or fosfomycin(3 g single dose).  Possible etiologies of recurrent infection include periurethral tissue atrophy in postmenopausal woman, constipation, sexual activity, incomplete emptying, anatomic abnormalities, and even genetic predisposition.  Finally, we discussed the role of perineal hygiene, timed voiding, adequate  hydration, topical vaginal estrogen, cranberry prophylaxis, and low-dose antibiotic prophylaxis.  -Trial of cranberry prophylaxis -Cipro 500 mg twice daily x10 days, then transition to Macrobid 100 mg daily prophylaxis -RTC 6 weeks for  symptom check -Consider Myrbetriq 25 mg daily if persistent OAB symptoms/urge incontinence with no evidence of infection -Consider imaging if recurrent infections  A total of 60 minutes were spent face-to-face with the patient, greater than 50% was spent in patient education, counseling, and coordination of care regarding recurrent UTIs and overactive bladder.   Billey Co, [facility] [address] [place], [place] [address] [phone]

## 2019-04-18 NOTE — Patient Instructions (Addendum)
1.  Start cranberry tablets twice daily 2.  Take Cipro twice daily for 10 days 3.  After finishing the Cipro, take 100 mg Macrobid daily for prevention  Urinary Tract Infection, Adult  A urinary tract infection (UTI) is an infection of any part of the urinary tract. The urinary tract includes the kidneys, ureters, bladder, and urethra. These organs make, store, and get rid of urine in the body. Your health care provider may use other names to describe the infection. An upper UTI affects the ureters and kidneys (pyelonephritis). A lower UTI affects the bladder (cystitis) and urethra (urethritis). What are the causes? Most urinary tract infections are caused by bacteria in your genital area, around the entrance to your urinary tract (urethra). These bacteria grow and cause inflammation of your urinary tract. What increases the risk? You are more likely to develop this condition if:  You have a urinary catheter that stays in place (indwelling).  You are not able to control when you urinate or have a bowel movement (you have incontinence).  You are female and you: ? Use a spermicide or diaphragm for birth control. ? Have low estrogen levels. ? Are pregnant.  You have certain genes that increase your risk (genetics).  You are sexually active.  You take antibiotic medicines.  You have a condition that causes your flow of urine to slow down, such as: ? An enlarged prostate, if you are female. ? Blockage in your urethra (stricture). ? A kidney stone. ? A nerve condition that affects your bladder control (neurogenic bladder). ? Not getting enough to drink, or not urinating often.  You have certain medical conditions, such as: ? Diabetes. ? A weak disease-fighting system (immunesystem). ? Sickle cell disease. ? Gout. ? Spinal cord injury. What are the signs or symptoms? Symptoms of this condition include:  Needing to urinate right away (urgently).  Frequent urination or passing  small amounts of urine frequently.  Pain or burning with urination.  Blood in the urine.  Urine that smells bad or unusual.  Trouble urinating.  Cloudy urine.  Vaginal discharge, if you are female.  Pain in the abdomen or the lower back. You may also have:  Vomiting or a decreased appetite.  Confusion.  Irritability or tiredness.  A fever.  Diarrhea. The first symptom in older adults may be confusion. In some cases, they may not have any symptoms until the infection has worsened. How is this diagnosed? This condition is diagnosed based on your medical history and a physical exam. You may also have other tests, including:  Urine tests.  Blood tests.  Tests for sexually transmitted infections (STIs). If you have had more than one UTI, a cystoscopy or imaging studies may be done to determine the cause of the infections. How is this treated? Treatment for this condition includes:  Antibiotic medicine.  Over-the-counter medicines to treat discomfort.  Drinking enough water to stay hydrated. If you have frequent infections or have other conditions such as a kidney stone, you may need to see a health care provider who specializes in the urinary tract (urologist). In rare cases, urinary tract infections can cause sepsis. Sepsis is a life-threatening condition that occurs when the body responds to an infection. Sepsis is treated in the hospital with IV antibiotics, fluids, and other medicines. Follow these instructions at home:  Medicines  Take over-the-counter and prescription medicines only as told by your health care provider.  If you were prescribed an antibiotic medicine, take it as told  by your health care provider. Do not stop using the antibiotic even if you start to feel better. General instructions  Make sure you: ? Empty your bladder often and completely. Do not hold urine for long periods of time. ? Empty your bladder after sex. ? Wipe from front to back  after a bowel movement if you are female. Use each tissue one time when you wipe.  Drink enough fluid to keep your urine pale yellow.  Keep all follow-up visits as told by your health care provider. This is important. Contact a health care provider if:  Your symptoms do not get better after 1-2 days.  Your symptoms go away and then return. Get help right away if you have:  Severe pain in your back or your lower abdomen.  A fever.  Nausea or vomiting. Summary  A urinary tract infection (UTI) is an infection of any part of the urinary tract, which includes the kidneys, ureters, bladder, and urethra.  Most urinary tract infections are caused by bacteria in your genital area, around the entrance to your urinary tract (urethra).  Treatment for this condition often includes antibiotic medicines.  If you were prescribed an antibiotic medicine, take it as told by your health care provider. Do not stop using the antibiotic even if you start to feel better.  Keep all follow-up visits as told by your health care provider. This is important. This information is not intended to replace advice given to you by your health care provider. Make sure you discuss any questions you have with your health care provider. Document Released: 03/23/2005 Document Revised: 05/31/2018 Document Reviewed: 12/21/2017 Elsevier Patient Education  2020 [employer].

## 2019-04-19 DIAGNOSIS — G20 Parkinson's disease: Secondary | ICD-10-CM | POA: Diagnosis not present

## 2019-04-19 DIAGNOSIS — R2689 Other abnormalities of gait and mobility: Secondary | ICD-10-CM | POA: Diagnosis not present

## 2019-04-19 DIAGNOSIS — M6281 Muscle weakness (generalized): Secondary | ICD-10-CM | POA: Diagnosis not present

## 2019-04-19 DIAGNOSIS — Z9181 History of falling: Secondary | ICD-10-CM | POA: Diagnosis not present

## 2019-04-19 DIAGNOSIS — R279 Unspecified lack of coordination: Secondary | ICD-10-CM | POA: Diagnosis not present

## 2019-04-21 LAB — CULTURE, URINE COMPREHENSIVE

## 2019-04-22 DIAGNOSIS — R2689 Other abnormalities of gait and mobility: Secondary | ICD-10-CM | POA: Diagnosis not present

## 2019-04-22 DIAGNOSIS — G20 Parkinson's disease: Secondary | ICD-10-CM | POA: Diagnosis not present

## 2019-04-22 DIAGNOSIS — M6281 Muscle weakness (generalized): Secondary | ICD-10-CM | POA: Diagnosis not present

## 2019-04-24 DIAGNOSIS — R1312 Dysphagia, oropharyngeal phase: Secondary | ICD-10-CM | POA: Diagnosis not present

## 2019-04-24 DIAGNOSIS — G20 Parkinson's disease: Secondary | ICD-10-CM | POA: Diagnosis not present

## 2019-04-24 DIAGNOSIS — R279 Unspecified lack of coordination: Secondary | ICD-10-CM | POA: Diagnosis not present

## 2019-04-24 DIAGNOSIS — Z9181 History of falling: Secondary | ICD-10-CM | POA: Diagnosis not present

## 2019-04-24 DIAGNOSIS — R2689 Other abnormalities of gait and mobility: Secondary | ICD-10-CM | POA: Diagnosis not present

## 2019-04-24 DIAGNOSIS — R490 Dysphonia: Secondary | ICD-10-CM | POA: Diagnosis not present

## 2019-04-24 DIAGNOSIS — M6281 Muscle weakness (generalized): Secondary | ICD-10-CM | POA: Diagnosis not present

## 2019-04-24 DIAGNOSIS — R41841 Cognitive communication deficit: Secondary | ICD-10-CM | POA: Diagnosis not present

## 2019-04-29 DIAGNOSIS — R2689 Other abnormalities of gait and mobility: Secondary | ICD-10-CM | POA: Diagnosis not present

## 2019-04-29 DIAGNOSIS — R490 Dysphonia: Secondary | ICD-10-CM | POA: Diagnosis not present

## 2019-04-29 DIAGNOSIS — R1312 Dysphagia, oropharyngeal phase: Secondary | ICD-10-CM | POA: Diagnosis not present

## 2019-04-29 DIAGNOSIS — R41841 Cognitive communication deficit: Secondary | ICD-10-CM | POA: Diagnosis not present

## 2019-04-29 DIAGNOSIS — G20 Parkinson's disease: Secondary | ICD-10-CM | POA: Diagnosis not present

## 2019-04-29 DIAGNOSIS — M6281 Muscle weakness (generalized): Secondary | ICD-10-CM | POA: Diagnosis not present

## 2019-05-01 DIAGNOSIS — G4701 Insomnia due to medical condition: Secondary | ICD-10-CM | POA: Diagnosis not present

## 2019-05-01 DIAGNOSIS — F419 Anxiety disorder, unspecified: Secondary | ICD-10-CM | POA: Diagnosis not present

## 2019-05-01 DIAGNOSIS — R4189 Other symptoms and signs involving cognitive functions and awareness: Secondary | ICD-10-CM | POA: Diagnosis not present

## 2019-05-01 DIAGNOSIS — G20 Parkinson's disease: Secondary | ICD-10-CM | POA: Diagnosis not present

## 2019-05-03 DIAGNOSIS — G20 Parkinson's disease: Secondary | ICD-10-CM | POA: Diagnosis not present

## 2019-05-03 DIAGNOSIS — R279 Unspecified lack of coordination: Secondary | ICD-10-CM | POA: Diagnosis not present

## 2019-05-03 DIAGNOSIS — M6281 Muscle weakness (generalized): Secondary | ICD-10-CM | POA: Diagnosis not present

## 2019-05-03 DIAGNOSIS — R2689 Other abnormalities of gait and mobility: Secondary | ICD-10-CM | POA: Diagnosis not present

## 2019-05-03 DIAGNOSIS — Z9181 History of falling: Secondary | ICD-10-CM | POA: Diagnosis not present

## 2019-05-06 DIAGNOSIS — R1312 Dysphagia, oropharyngeal phase: Secondary | ICD-10-CM | POA: Diagnosis not present

## 2019-05-06 DIAGNOSIS — R2689 Other abnormalities of gait and mobility: Secondary | ICD-10-CM | POA: Diagnosis not present

## 2019-05-06 DIAGNOSIS — R490 Dysphonia: Secondary | ICD-10-CM | POA: Diagnosis not present

## 2019-05-06 DIAGNOSIS — M6281 Muscle weakness (generalized): Secondary | ICD-10-CM | POA: Diagnosis not present

## 2019-05-06 DIAGNOSIS — R41841 Cognitive communication deficit: Secondary | ICD-10-CM | POA: Diagnosis not present

## 2019-05-06 DIAGNOSIS — G20 Parkinson's disease: Secondary | ICD-10-CM | POA: Diagnosis not present

## 2019-05-08 DIAGNOSIS — Z9181 History of falling: Secondary | ICD-10-CM | POA: Diagnosis not present

## 2019-05-08 DIAGNOSIS — M6281 Muscle weakness (generalized): Secondary | ICD-10-CM | POA: Diagnosis not present

## 2019-05-08 DIAGNOSIS — G20 Parkinson's disease: Secondary | ICD-10-CM | POA: Diagnosis not present

## 2019-05-08 DIAGNOSIS — R2689 Other abnormalities of gait and mobility: Secondary | ICD-10-CM | POA: Diagnosis not present

## 2019-05-08 DIAGNOSIS — R279 Unspecified lack of coordination: Secondary | ICD-10-CM | POA: Diagnosis not present

## 2019-05-10 DIAGNOSIS — G20 Parkinson's disease: Secondary | ICD-10-CM | POA: Diagnosis not present

## 2019-05-10 DIAGNOSIS — R279 Unspecified lack of coordination: Secondary | ICD-10-CM | POA: Diagnosis not present

## 2019-05-10 DIAGNOSIS — R2689 Other abnormalities of gait and mobility: Secondary | ICD-10-CM | POA: Diagnosis not present

## 2019-05-10 DIAGNOSIS — M6281 Muscle weakness (generalized): Secondary | ICD-10-CM | POA: Diagnosis not present

## 2019-05-10 DIAGNOSIS — Z9181 History of falling: Secondary | ICD-10-CM | POA: Diagnosis not present

## 2019-05-13 DIAGNOSIS — G20 Parkinson's disease: Secondary | ICD-10-CM | POA: Diagnosis not present

## 2019-05-13 DIAGNOSIS — R41841 Cognitive communication deficit: Secondary | ICD-10-CM | POA: Diagnosis not present

## 2019-05-13 DIAGNOSIS — R490 Dysphonia: Secondary | ICD-10-CM | POA: Diagnosis not present

## 2019-05-13 DIAGNOSIS — M6281 Muscle weakness (generalized): Secondary | ICD-10-CM | POA: Diagnosis not present

## 2019-05-13 DIAGNOSIS — R1312 Dysphagia, oropharyngeal phase: Secondary | ICD-10-CM | POA: Diagnosis not present

## 2019-05-13 DIAGNOSIS — R2689 Other abnormalities of gait and mobility: Secondary | ICD-10-CM | POA: Diagnosis not present

## 2019-05-15 DIAGNOSIS — Z9181 History of falling: Secondary | ICD-10-CM | POA: Diagnosis not present

## 2019-05-15 DIAGNOSIS — R279 Unspecified lack of coordination: Secondary | ICD-10-CM | POA: Diagnosis not present

## 2019-05-15 DIAGNOSIS — G20 Parkinson's disease: Secondary | ICD-10-CM | POA: Diagnosis not present

## 2019-05-15 DIAGNOSIS — R2689 Other abnormalities of gait and mobility: Secondary | ICD-10-CM | POA: Diagnosis not present

## 2019-05-15 DIAGNOSIS — M6281 Muscle weakness (generalized): Secondary | ICD-10-CM | POA: Diagnosis not present

## 2019-05-17 ENCOUNTER — Emergency Department
Admission: EM | Admit: 2019-05-17 | Discharge: 2019-05-18 | Disposition: A | Discharge status: Home / Self Care | Payer: PPO | Attending: Emergency Medicine | Admitting: Emergency Medicine

## 2019-05-17 ENCOUNTER — Other Ambulatory Visit: Payer: Self-pay

## 2019-05-17 ENCOUNTER — Encounter: Payer: Self-pay | Admitting: Intensive Care

## 2019-05-17 DIAGNOSIS — Z950 Presence of cardiac pacemaker: Secondary | ICD-10-CM | POA: Diagnosis not present

## 2019-05-17 DIAGNOSIS — M6281 Muscle weakness (generalized): Secondary | ICD-10-CM | POA: Diagnosis not present

## 2019-05-17 DIAGNOSIS — G20 Parkinson's disease: Secondary | ICD-10-CM | POA: Diagnosis not present

## 2019-05-17 DIAGNOSIS — Z79899 Other long term (current) drug therapy: Secondary | ICD-10-CM | POA: Insufficient documentation

## 2019-05-17 DIAGNOSIS — R600 Localized edema: Secondary | ICD-10-CM

## 2019-05-17 DIAGNOSIS — Z9181 History of falling: Secondary | ICD-10-CM | POA: Diagnosis not present

## 2019-05-17 DIAGNOSIS — R279 Unspecified lack of coordination: Secondary | ICD-10-CM | POA: Diagnosis not present

## 2019-05-17 DIAGNOSIS — R2689 Other abnormalities of gait and mobility: Secondary | ICD-10-CM | POA: Diagnosis not present

## 2019-05-17 DIAGNOSIS — I10 Essential (primary) hypertension: Secondary | ICD-10-CM | POA: Insufficient documentation

## 2019-05-17 HISTORY — DX: Parkinson's disease without dyskinesia, without mention of fluctuations: G20.A1

## 2019-05-17 HISTORY — DX: Parkinson's disease: G20

## 2019-05-17 HISTORY — DX: Anxiety disorder, unspecified: F41.9

## 2019-05-17 LAB — COMPREHENSIVE METABOLIC PANEL
ALT: 6 U/L (ref 0–44)
AST: 21 U/L (ref 15–41)
Albumin: 3.8 g/dL (ref 3.5–5.0)
Alkaline Phosphatase: 58 U/L (ref 38–126)
Anion gap: 13 (ref 5–15)
BUN: 13 mg/dL (ref 8–23)
CO2: 24 mmol/L (ref 22–32)
Calcium: 9.2 mg/dL (ref 8.9–10.3)
Chloride: 101 mmol/L (ref 98–111)
Creatinine, Ser: 0.56 mg/dL (ref 0.44–1.00)
GFR calc Af Amer: >60 mL/min (ref >60)
GFR calc non Af Amer: >60 mL/min (ref >60)
Glucose, Bld: 90 mg/dL (ref 70–99)
Potassium: 3.5 mmol/L (ref 3.5–5.1)
Sodium: 138 mmol/L (ref 135–145)
Total Bilirubin: 0.8 mg/dL (ref 0.3–1.2)
Total Protein: 6.7 g/dL (ref 6.5–8.1)

## 2019-05-17 LAB — CBC
HCT: 34.6 % — ABNORMAL LOW (ref 36.0–46.0)
Hemoglobin: 11.2 g/dL — ABNORMAL LOW (ref 12.0–15.0)
MCH: 28.6 pg (ref 26.0–34.0)
MCHC: 32.4 g/dL (ref 30.0–36.0)
MCV: 88.5 fL (ref 80.0–100.0)
Platelets: 250 10*3/uL (ref 150–400)
RBC: 3.91 MIL/uL (ref 3.87–5.11)
RDW: 13.6 % (ref 11.5–15.5)
WBC: 10.2 10*3/uL (ref 4.0–10.5)
nRBC: 0 % (ref 0.0–0.2)

## 2019-05-17 LAB — DIFFERENTIAL
Abs Immature Granulocytes: 0.19 10*3/uL — ABNORMAL HIGH (ref 0.00–0.07)
Basophils Absolute: 0.1 10*3/uL (ref 0.0–0.1)
Basophils Relative: 1 %
Eosinophils Absolute: 1.9 10*3/uL — ABNORMAL HIGH (ref 0.0–0.5)
Eosinophils Relative: 19 %
Immature Granulocytes: 2 %
Lymphocytes Relative: 11 %
Lymphs Abs: 1.1 10*3/uL (ref 0.7–4.0)
Monocytes Absolute: 1.6 10*3/uL — ABNORMAL HIGH (ref 0.1–1.0)
Monocytes Relative: 16 %
Neutro Abs: 5.3 10*3/uL (ref 1.7–7.7)
Neutrophils Relative %: 51 %

## 2019-05-17 LAB — TROPONIN I (HIGH SENSITIVITY): Troponin I (High Sensitivity): 4 ng/L (ref <18)

## 2019-05-17 LAB — BRAIN NATRIURETIC PEPTIDE: B Natriuretic Peptide: 291 pg/mL — ABNORMAL HIGH (ref 0.0–100.0)

## 2019-05-17 NOTE — ED Notes (Signed)
ED Provider at bedside.  Pt with daughter, Rollator at bedside  Pt c/o new onset lower bilateral leg swelling, hx of Parkinson's and recent increase on Sinemet, pt reports pain in legs and swelling and difficulty getting compression stocking on  Pt encouraged to elevate legs and CHF discussed

## 2019-05-17 NOTE — ED Provider Notes (Signed)
[facility] Emergency Department Provider Note  ____________________________________________   I have reviewed the triage vital signs and the nursing notes.   HISTORY  Chief Complaint Leg Swelling (bilateral)   History limited by: Not Limited   HPI Kara Levine is a 85 y.o. female who presents to the emergency department today because of concern for bilateral lower extremity edema. The patient has had some swelling in the past on and off, however it had been confined to the ankles. Over the past 2 days she has had increasing swelling in her legs and it has come up to her thighs. The patient has not had any associated chest pain or shortness of breath. Has some soreness to her legs. The patient does have a pacemaker but denies any history of heart failure. Last had an echocardiogram a number of years ago in [place]. No fevers.   Records reviewed. Per medical record review patient has a history of HTN, parkinson's disease.   Past Medical History:  Diagnosis Date  . Anxiety   . Hypertension   . Parkinson's disease ([place])     There are no active problems to display for this patient.   Past Surgical History:  Procedure Laterality Date  . ABDOMINAL HYSTERECTOMY    . APPENDECTOMY    . ELBOW FRACTURE SURGERY Bilateral   . EYE SURGERY    . PACEMAKER IMPLANT      Prior to Admission medications   Medication Sig Start Date End Date Taking? Authorizing Provider  bisoprolol (ZEBETA) 5 MG tablet Take 5-10 mg by mouth 2 (two) times daily.     [provider]  carbidopa-levodopa (SINEMET IR) 25-100 MG tablet Take by mouth. 03/15/19 03/14/20  [provider]  ciprofloxacin (CIPRO) 500 MG tablet Take 1 tablet (500 mg total) by mouth 2 (two) times daily. 04/18/19   Billey Co, MD  hydrALAZINE (APRESOLINE) 10 MG tablet Take 10-20 mg by mouth daily as needed (BP <160).    [provider]  hydrALAZINE (APRESOLINE) 25 MG tablet  Take 25 mg by mouth 3 (three) times daily. 03/04/19   [provider]  hydrocortisone 2.5 % cream  10/10/18   [provider]  nitrofurantoin, macrocrystal-monohydrate, (MACROBID) 100 MG capsule Take 1 capsule (100 mg total) by mouth daily. 04/18/19   Billey Co, MD  omeprazole (PRILOSEC) 20 MG capsule Take by mouth. 10/04/18 10/04/19  [provider]  oxazepam (SERAX) 10 MG capsule Take 10 mg by mouth daily as needed for anxiety (raised blood pressure).    [provider]    Allergies Codeine, Keflex [cephalexin], Norco [hydrocodone-acetaminophen], and Shellfish allergy  History reviewed. No pertinent family history.  Social History Social History   Tobacco Use  . Smoking status: Never Smoker  . Smokeless tobacco: Never Used  Substance Use Topics  . Alcohol use: Never    Frequency: Never  . Drug use: Never    Review of Systems Constitutional: No fever/chills Eyes: No visual changes. ENT: No sore throat. Cardiovascular: Denies chest pain. Respiratory: Denies shortness of breath. Gastrointestinal: No abdominal pain.  No nausea, no vomiting.  No diarrhea.   Genitourinary: Negative for dysuria. Musculoskeletal: Positive for bilateral leg swelling.  Skin: Negative for rash. Neurological: Negative for headaches, focal weakness or numbness.  ____________________________________________   PHYSICAL EXAM:  VITAL SIGNS: ED Triage Vitals [05/17/19 1537]  Enc Vitals Group     BP (!) 190/77     Pulse Rate 66     Resp  16     Temp 98 F (36.7 C)     Temp Source Oral     SpO2 98 %     Weight 120 lb (54.4 kg)     Height 5\' 4"  (1.626 m)     Head Circumference      Peak Flow      Pain Score 0   Constitutional: Alert and oriented.  Eyes: Conjunctivae are normal.  ENT      Head: Normocephalic and atraumatic.      Nose: No congestion/rhinnorhea.      Mouth/Throat: Mucous membranes are moist.      Neck: No  stridor. Hematological/Lymphatic/Immunilogical: No cervical lymphadenopathy. Cardiovascular: Normal rate, regular rhythm.  No murmurs, rubs, or gallops. Respiratory: Normal respiratory effort without tachypnea nor retractions. Breath sounds are clear and equal bilaterally. No wheezes/rales/rhonchi. Gastrointestinal: Soft and non tender. No rebound. No guarding.  Genitourinary: Deferred Musculoskeletal: Normal range of motion in all extremities. 2+ pitting edema bilaterally in the lower extremities.  Neurologic:  Normal speech and language. No gross focal neurologic deficits are appreciated.  Skin:  Skin is warm, dry and intact.  Psychiatric: Mood and affect are normal. Speech and behavior are normal. Patient exhibits appropriate insight and judgment.  ____________________________________________    LABS (pertinent positives/negatives)  CBC wbc 10.2, hgb 11.2, plt 250 BNP 291.0 CMP wnl Trop hs 4  ____________________________________________   EKG  I, Nance Pear, attending physician, personally viewed and interpreted this EKG  EKG Time: 1921 Rate: 65 Rhythm: normal sinus rhythm Axis: normal Intervals: qtc 474 QRS: RBBB ST changes: no st elevation Impression: abnormal ekg ____________________________________________    RADIOLOGY  None  ____________________________________________   PROCEDURES  Procedures  ____________________________________________   INITIAL IMPRESSION / ASSESSMENT AND PLAN / ED COURSE  Pertinent labs & imaging results that were available during my care of the patient were reviewed by me and considered in my medical decision making (see chart for details).   Patient presented to the emergency department today because of concern for bilateral leg swelling. On exam the patient does have pitting edema in bilateral lower extremities. No concerning erythema or open sores at this time to suggest infection. Blood work with slight elevation of BNP.  Kidney and liver function normal. Discussed blood work and findings with patient. Will plan on starting small course of lasix. Discussed follow up with cardiologist. Discussed shortness of breath return precautions.   ____________________________________________   FINAL CLINICAL IMPRESSION(S) / ED DIAGNOSES  Final diagnoses:  Bilateral lower extremity edema     Note: This dictation was prepared with Dragon dictation. Any transcriptional errors that result from this process are unintentional     Nance Pear, MD 05/18/19 [phone]

## 2019-05-17 NOTE — ED Triage Notes (Signed)
Patient arrived by POV with daughter in law from [facility]. Presents with bilateral lower leg swelling X1 week. Denies SOB or CP.

## 2019-05-18 MED ORDER — FUROSEMIDE 20 MG PO TABS: 20.0000 mg | ORAL_TABLET | Freq: Every day | ORAL | 0 refills | Status: DC

## 2019-05-18 NOTE — ED Notes (Signed)
No peripheral IV placed this visit.   Discharge instructions reviewed with patient. Questions fielded by this RN. Patient verbalizes understanding of instructions. Patient discharged home in stable condition per goodman. No acute distress noted at time of discharge.    Pt wheeled to car, daughter given Chux

## 2019-05-18 NOTE — Discharge Instructions (Addendum)
Please seek medical attention for any high fevers, chest pain, shortness of breath, change in behavior, persistent vomiting, bloody stool or any other new or concerning symptoms.  

## 2019-05-20 DIAGNOSIS — R1312 Dysphagia, oropharyngeal phase: Secondary | ICD-10-CM | POA: Diagnosis not present

## 2019-05-20 DIAGNOSIS — R2689 Other abnormalities of gait and mobility: Secondary | ICD-10-CM | POA: Diagnosis not present

## 2019-05-20 DIAGNOSIS — M6281 Muscle weakness (generalized): Secondary | ICD-10-CM | POA: Diagnosis not present

## 2019-05-20 DIAGNOSIS — R279 Unspecified lack of coordination: Secondary | ICD-10-CM | POA: Diagnosis not present

## 2019-05-20 DIAGNOSIS — R490 Dysphonia: Secondary | ICD-10-CM | POA: Diagnosis not present

## 2019-05-20 DIAGNOSIS — G20 Parkinson's disease: Secondary | ICD-10-CM | POA: Diagnosis not present

## 2019-05-20 DIAGNOSIS — Z9181 History of falling: Secondary | ICD-10-CM | POA: Diagnosis not present

## 2019-05-20 DIAGNOSIS — R41841 Cognitive communication deficit: Secondary | ICD-10-CM | POA: Diagnosis not present

## 2019-05-22 DIAGNOSIS — G20 Parkinson's disease: Secondary | ICD-10-CM | POA: Diagnosis not present

## 2019-05-22 DIAGNOSIS — R2689 Other abnormalities of gait and mobility: Secondary | ICD-10-CM | POA: Diagnosis not present

## 2019-05-22 DIAGNOSIS — M6281 Muscle weakness (generalized): Secondary | ICD-10-CM | POA: Diagnosis not present

## 2019-05-22 DIAGNOSIS — R279 Unspecified lack of coordination: Secondary | ICD-10-CM | POA: Diagnosis not present

## 2019-05-22 DIAGNOSIS — Z9181 History of falling: Secondary | ICD-10-CM | POA: Diagnosis not present

## 2019-05-24 DIAGNOSIS — G20 Parkinson's disease: Secondary | ICD-10-CM | POA: Diagnosis not present

## 2019-05-24 DIAGNOSIS — R2689 Other abnormalities of gait and mobility: Secondary | ICD-10-CM | POA: Diagnosis not present

## 2019-05-24 DIAGNOSIS — M6281 Muscle weakness (generalized): Secondary | ICD-10-CM | POA: Diagnosis not present

## 2019-05-27 DIAGNOSIS — R1312 Dysphagia, oropharyngeal phase: Secondary | ICD-10-CM | POA: Diagnosis not present

## 2019-05-27 DIAGNOSIS — R7989 Other specified abnormal findings of blood chemistry: Secondary | ICD-10-CM | POA: Diagnosis not present

## 2019-05-27 DIAGNOSIS — R490 Dysphonia: Secondary | ICD-10-CM | POA: Diagnosis not present

## 2019-05-27 DIAGNOSIS — R41841 Cognitive communication deficit: Secondary | ICD-10-CM | POA: Diagnosis not present

## 2019-05-27 DIAGNOSIS — G20 Parkinson's disease: Secondary | ICD-10-CM | POA: Diagnosis not present

## 2019-05-27 DIAGNOSIS — R600 Localized edema: Secondary | ICD-10-CM | POA: Diagnosis not present

## 2019-05-29 DIAGNOSIS — H43813 Vitreous degeneration, bilateral: Secondary | ICD-10-CM | POA: Diagnosis not present

## 2019-05-30 DIAGNOSIS — R7989 Other specified abnormal findings of blood chemistry: Secondary | ICD-10-CM | POA: Diagnosis not present

## 2019-05-30 DIAGNOSIS — G20 Parkinson's disease: Secondary | ICD-10-CM | POA: Diagnosis not present

## 2019-05-30 DIAGNOSIS — Z9181 History of falling: Secondary | ICD-10-CM | POA: Diagnosis not present

## 2019-05-30 DIAGNOSIS — R2689 Other abnormalities of gait and mobility: Secondary | ICD-10-CM | POA: Diagnosis not present

## 2019-05-30 DIAGNOSIS — I10 Essential (primary) hypertension: Secondary | ICD-10-CM | POA: Diagnosis not present

## 2019-05-30 DIAGNOSIS — M6281 Muscle weakness (generalized): Secondary | ICD-10-CM | POA: Diagnosis not present

## 2019-05-30 DIAGNOSIS — Z950 Presence of cardiac pacemaker: Secondary | ICD-10-CM | POA: Diagnosis not present

## 2019-05-30 DIAGNOSIS — R609 Edema, unspecified: Secondary | ICD-10-CM | POA: Diagnosis not present

## 2019-05-30 DIAGNOSIS — R279 Unspecified lack of coordination: Secondary | ICD-10-CM | POA: Diagnosis not present

## 2019-05-31 ENCOUNTER — Encounter
Admission: RE | Admit: 2019-05-31 | Discharge: 2019-05-31 | Disposition: A | Discharge status: Home / Self Care | Payer: PPO | Source: Ambulatory Visit | Attending: Internal Medicine | Admitting: Internal Medicine

## 2019-06-03 DIAGNOSIS — R609 Edema, unspecified: Secondary | ICD-10-CM | POA: Diagnosis not present

## 2019-06-04 DIAGNOSIS — I509 Heart failure, unspecified: Secondary | ICD-10-CM | POA: Diagnosis not present

## 2019-06-05 DIAGNOSIS — I509 Heart failure, unspecified: Secondary | ICD-10-CM | POA: Insufficient documentation

## 2019-06-05 DIAGNOSIS — G20 Parkinson's disease: Secondary | ICD-10-CM | POA: Diagnosis not present

## 2019-06-05 DIAGNOSIS — I480 Paroxysmal atrial fibrillation: Secondary | ICD-10-CM | POA: Diagnosis not present

## 2019-06-06 ENCOUNTER — Ambulatory Visit: Payer: PPO | Admitting: Urology

## 2019-06-06 ENCOUNTER — Other Ambulatory Visit: Payer: Self-pay

## 2019-06-06 ENCOUNTER — Encounter: Payer: Self-pay | Admitting: Urology

## 2019-06-06 VITALS — BP 155/67 | HR 60 | Ht 64.0 in | Wt 122.0 lb

## 2019-06-06 DIAGNOSIS — N3281 Overactive bladder: Secondary | ICD-10-CM | POA: Diagnosis not present

## 2019-06-06 DIAGNOSIS — R002 Palpitations: Secondary | ICD-10-CM | POA: Diagnosis not present

## 2019-06-06 DIAGNOSIS — N390 Urinary tract infection, site not specified: Secondary | ICD-10-CM | POA: Diagnosis not present

## 2019-06-06 DIAGNOSIS — Z79899 Other long term (current) drug therapy: Secondary | ICD-10-CM | POA: Diagnosis not present

## 2019-06-06 DIAGNOSIS — I480 Paroxysmal atrial fibrillation: Secondary | ICD-10-CM | POA: Diagnosis not present

## 2019-06-06 DIAGNOSIS — I509 Heart failure, unspecified: Secondary | ICD-10-CM | POA: Diagnosis not present

## 2019-06-06 DIAGNOSIS — Z950 Presence of cardiac pacemaker: Secondary | ICD-10-CM | POA: Diagnosis not present

## 2019-06-06 DIAGNOSIS — G20 Parkinson's disease: Secondary | ICD-10-CM | POA: Diagnosis not present

## 2019-06-06 DIAGNOSIS — I10 Essential (primary) hypertension: Secondary | ICD-10-CM | POA: Diagnosis not present

## 2019-06-06 LAB — BLADDER SCAN AMB NON-IMAGING

## 2019-06-06 MED ORDER — MIRABEGRON ER 50 MG PO TB24: 50.0000 mg | ORAL_TABLET | Freq: Every day | ORAL | 11 refills | Status: DC

## 2019-06-06 NOTE — Progress Notes (Signed)
   06/06/2019 7:31 PM   Kara Levine 1931/09/18 [id number]  Reason for visit: Follow up rUTIs, urge incontinence  HPI: I saw Kara Levine and her daughter in urology clinic today for follow-up of recurrent UTIs and urge incontinence.  She is an 85 year old very frail female who is recently moved into a nursing home.  I saw her on 04/18/2019 primarily for recurrent UTIs, but also urinary leakage that she was unaware that she would leak as well as some urgency and urge incontinence.  She had a Proteus UTI at that visit which was treated with culture appropriate antibiotics.  She reports this has improved some of her urinary symptoms, but she continues to have bothersome incontinence.  Urinalysis today benign appearing with no bacteria, 0 RBCs, 0-5 WBCs, nitrite negative, PVR 63ml.  At our last visit I had recommended cranberry prophylaxis and prophylactic Macrobid daily, and consideration of Myrbetriq if she had persistent symptoms despite treatment of her UTI.  It sounds like she is having a very difficult time adjusting to being in a nursing home, and she remains very frail and wheelchair-bound.  She reports she does not want to take the prophylactic Macrobid anymore as she has "been on too many antibiotics."  We discussed the risks of discontinuing antibiotic prophylaxis, but she is adamant she does not want to take antibiotics for prophylaxis anymore.  -I recommended a trial of Myrbetriq 50 mg daily for her urgency and urge incontinence -Continue cranberry prophylaxis for her history of UTIs -Stop Macrobid prophylaxis per patient preference -RTC 3 months with PA for symptom check  A total of 15 minutes were spent face-to-face with the patient, greater than 50% was spent in patient education, counseling, and coordination of care regarding recurrent UTIs, overactive bladder, and incontinence.  Billey Co, [facility] [address], [place]  [place], [place] [address] [phone]

## 2019-06-07 LAB — MICROSCOPIC EXAMINATION
Bacteria, UA: NONE SEEN
RBC, Urine: NONE SEEN /hpf (ref 0–2)

## 2019-06-07 LAB — URINALYSIS, COMPLETE
Bilirubin, UA: NEGATIVE
Glucose, UA: NEGATIVE
Ketones, UA: NEGATIVE
Leukocytes,UA: NEGATIVE
Nitrite, UA: NEGATIVE
Protein,UA: NEGATIVE
RBC, UA: NEGATIVE
Specific Gravity, UA: 1.015 (ref 1.005–1.030)
Urobilinogen, Ur: 0.2 mg/dL (ref 0.2–1.0)
pH, UA: 7 (ref 5.0–7.5)

## 2019-06-11 ENCOUNTER — Other Ambulatory Visit
Admission: RE | Admit: 2019-06-11 | Discharge: 2019-06-11 | Disposition: A | Discharge status: Home / Self Care | Payer: PPO | Source: Skilled Nursing Facility | Attending: Internal Medicine | Admitting: Internal Medicine

## 2019-06-11 DIAGNOSIS — R609 Edema, unspecified: Secondary | ICD-10-CM | POA: Diagnosis not present

## 2019-06-11 DIAGNOSIS — I509 Heart failure, unspecified: Secondary | ICD-10-CM | POA: Insufficient documentation

## 2019-06-11 LAB — BASIC METABOLIC PANEL
Anion gap: 9 (ref 5–15)
BUN: 16 mg/dL (ref 8–23)
CO2: 24 mmol/L (ref 22–32)
Calcium: 8.8 mg/dL — ABNORMAL LOW (ref 8.9–10.3)
Chloride: 94 mmol/L — ABNORMAL LOW (ref 98–111)
Creatinine, Ser: 0.58 mg/dL (ref 0.44–1.00)
GFR calc Af Amer: >60 mL/min (ref >60)
GFR calc non Af Amer: >60 mL/min (ref >60)
Glucose, Bld: 78 mg/dL (ref 70–99)
Potassium: 4.7 mmol/L (ref 3.5–5.1)
Sodium: 127 mmol/L — ABNORMAL LOW (ref 135–145)

## 2019-06-12 DIAGNOSIS — Z20828 Contact with and (suspected) exposure to other viral communicable diseases: Secondary | ICD-10-CM | POA: Diagnosis not present

## 2019-06-14 ENCOUNTER — Other Ambulatory Visit: Payer: Self-pay

## 2019-06-14 ENCOUNTER — Encounter (INDEPENDENT_AMBULATORY_CARE_PROVIDER_SITE_OTHER): Payer: Self-pay | Admitting: Vascular Surgery

## 2019-06-14 ENCOUNTER — Ambulatory Visit (INDEPENDENT_AMBULATORY_CARE_PROVIDER_SITE_OTHER): Payer: PPO | Admitting: Vascular Surgery

## 2019-06-14 DIAGNOSIS — M7989 Other specified soft tissue disorders: Secondary | ICD-10-CM | POA: Diagnosis not present

## 2019-06-14 DIAGNOSIS — I890 Lymphedema, not elsewhere classified: Secondary | ICD-10-CM | POA: Insufficient documentation

## 2019-06-14 DIAGNOSIS — I10 Essential (primary) hypertension: Secondary | ICD-10-CM

## 2019-06-14 NOTE — Progress Notes (Signed)
Patient ID: MAZAL SEDENO, female   DOB: 12/13/1931, 85 y.o.   MRN: [id number]  Chief Complaint  Patient presents with  . New Patient (Initial Visit)    ref Parachos lymphedema    HPI Kara Levine is a 85 y.o. female.  I am asked to see the patient by Dr. Saralyn Pilar for evaluation of leg swelling and lymphedema.  The patient has had marked leg swelling now for several weeks.  There was no clear inciting event or causative factor that started her symptoms.  She was started on diuretic therapy and put in compression socks which have helped her swelling significantly although it is still quite prominent.  Both legs are affected.  She has quite severe neuropathy in her legs so it is not all that painful.  She does not have open ulceration or weeping currently, but both legs are quite swollen.  Her cardiologist referred her for further evaluation due to the likelihood of venous insufficiency or lymphedema as being major contributing factors.  She does not have a history of DVT or superficial thrombophlebitis to her knowledge.     Past Medical History:  Diagnosis Date  . Anxiety   . Hypertension   . Parkinson's disease [facility])     Past Surgical History:  Procedure Laterality Date  . ABDOMINAL HYSTERECTOMY    . APPENDECTOMY    . ELBOW FRACTURE SURGERY Bilateral   . EYE SURGERY    . PACEMAKER IMPLANT       Family History  Problem Relation Age of Onset  . Stroke Mother   . Hypertension Mother   . Diabetes Father   . Diabetes Sister      Social History   Tobacco Use  . Smoking status: Never Smoker  . Smokeless tobacco: Never Used  Substance Use Topics  . Alcohol use: Never  . Drug use: Never     Allergies  Allergen Reactions  . Codeine   . Keflex [Cephalexin]   . Norco [Hydrocodone-Acetaminophen]   . Shellfish Allergy Nausea Only    Patient stated it was like food poison     Current Outpatient Medications  Medication Sig Dispense Refill  . bisoprolol  (ZEBETA) 5 MG tablet Take 5-10 mg by mouth 2 (two) times daily.     . busPIRone (BUSPAR) 5 MG tablet Take by mouth.    . carbidopa-levodopa (SINEMET IR) 25-100 MG tablet Take by mouth.    . Cranberry-Vitamin C (AZO CRANBERRY URINARY TRACT) 250-60 MG CAPS Take by mouth 2 (two) times daily.    . furosemide (LASIX) 40 MG tablet Take 40 mg by mouth 2 (two) times daily.    . hydrALAZINE (APRESOLINE) 25 MG tablet Take 25 mg by mouth 3 (three) times daily.    . hydrocortisone 2.5 % cream     . mirabegron ER (MYRBETRIQ) 50 MG TB24 tablet Take 1 tablet (50 mg total) by mouth daily. 30 tablet 11  . nitrofurantoin (MACRODANTIN) 100 MG capsule     . omeprazole (PRILOSEC) 20 MG capsule Take by mouth.    . oxazepam (SERAX) 10 MG capsule Take 10 mg by mouth daily as needed for anxiety (raised blood pressure).    . potassium chloride (MICRO-K) 10 MEQ CR capsule Take 10 mEq by mouth daily.    Marland Kitchen spironolactone (ALDACTONE) 25 MG tablet Take 25 mg by mouth daily.     No current facility-administered medications for this visit.      REVIEW OF SYSTEMS (Negative unless checked)  Constitutional: [] Weight loss  [] Fever  [] Chills Cardiac: [] Chest pain   [] Chest pressure   [] Palpitations   [] Shortness of breath when laying flat   [] Shortness of breath at rest   [] Shortness of breath with exertion. Vascular:  [] Pain in legs with walking   [] Pain in legs at rest   [] Pain in legs when laying flat   [] Claudication   [] Pain in feet when walking  [] Pain in feet at rest  [] Pain in feet when laying flat   [] History of DVT   [] Phlebitis   [x] Swelling in legs   [] Varicose veins   [] Non-healing ulcers Pulmonary:   [] Uses home oxygen   [] Productive cough   [] Hemoptysis   [] Wheeze  [] COPD   [] Asthma Neurologic:  [] Dizziness  [] Blackouts   [] Seizures   [] History of stroke   [] History of TIA  [] Aphasia   [] Temporary blindness   [] Dysphagia   [] Weakness or numbness in arms   [x] Weakness or numbness in legs Musculoskeletal:   [x] Arthritis   [] Joint swelling   [x] Joint pain   [] Low back pain Hematologic:  [] Easy bruising  [] Easy bleeding   [] Hypercoagulable state   [] Anemic  [] Hepatitis Gastrointestinal:  [] Blood in stool   [] Vomiting blood  [] Gastroesophageal reflux/heartburn   [] Abdominal pain Genitourinary:  [] Chronic kidney disease   [] Difficult urination  [] Frequent urination  [] Burning with urination   [] Hematuria Skin:  [] Rashes   [] Ulcers   [] Wounds Psychological:  [] History of anxiety   []  History of major depression.    Physical Exam BP (!) 190/72 (BP Location: Left Arm)   Pulse 62   Resp 15  Gen:  WD/WN, NAD. Appears younger than stated age. Head: [place]/AT, No temporalis wasting.  Ear/Nose/Throat: Hearing grossly intact, nares w/o erythema or drainage, oropharynx w/o Erythema/Exudate Eyes: Conjunctiva clear, sclera non-icteric  Neck: trachea midline.  No JVD.  Pulmonary:  Good air movement, respirations not labored, no use of accessory muscles  Cardiac: RRR, no JVD Vascular:  Vessel Right Left  Radial Palpable Palpable                                   Gastrointestinal:. No masses, surgical incisions, or scars. Musculoskeletal: M/S 5/5 throughout.  Extremities without ischemic changes.  No deformity or atrophy.  2-3+ bilateral lower extremity edema.  Moderate stasis changes are present bilaterally Neurologic: Sensation diminished in both lower legs.  Symmetrical.  Speech is fluent. Motor exam as listed above. Psychiatric: Judgment intact, Mood & affect appropriate for pt's clinical situation. Dermatologic: No rashes or ulcers noted.  No cellulitis or open wounds.    Radiology No results found.  Labs Recent Results (from the past 2160 hour(s))  Urinalysis, Complete     Status: Abnormal   Collection Time: 04/18/19 11:14 AM  Result Value Ref Range   Specific Gravity, UA 1.020 1.005 - 1.030   pH, UA 8.5 (H) 5.0 - 7.5   Color, UA Yellow Yellow   Appearance Ur Cloudy (A) Clear    Leukocytes,UA 3+ (A) Negative   Protein,UA 1+ (A) Negative/Trace   Glucose, UA Negative Negative   Ketones, UA Negative Negative   RBC, UA Negative Negative   Bilirubin, UA Negative Negative   Urobilinogen, Ur 0.2 0.2 - 1.0 mg/dL   Nitrite, UA Positive (A) Negative   Microscopic Examination See below:   Microscopic Examination     Status: Abnormal   Collection Time: 04/18/19 11:14 AM   URINE  Result Value Ref Range   WBC, UA >30 (A) 0 - 5 /hpf   RBC None seen 0 - 2 /hpf   Epithelial Cells (non renal) 0-10 0 - 10 /hpf   Crystals Present (A) N/A   Crystal Type Amorphous Sediment N/A   Bacteria, UA Many (A) None seen/Few  Bladder Scan (Post Void Residual) in office     Status: None   Collection Time: 04/18/19 11:25 AM  Result Value Ref Range   Scan Result 116   CULTURE, URINE COMPREHENSIVE     Status: Abnormal   Collection Time: 04/18/19  1:25 PM   Specimen: Urine   UR  Result Value Ref Range   Urine Culture, Comprehensive Final report (A)    Organism ID, Bacteria Proteus mirabilis (A)     Comment: Greater than 100,000 colony forming units per mL Cefazolin <=4 ug/mL Cefazolin with an MIC <=16 predicts susceptibility to the oral agents cefaclor, cefdinir, cefpodoxime, cefprozil, cefuroxime, cephalexin, and loracarbef when used for therapy of uncomplicated urinary tract infections due to E. coli, Klebsiella pneumoniae, and Proteus mirabilis.    ANTIMICROBIAL SUSCEPTIBILITY Comment     Comment:       ** S = Susceptible; I = Intermediate; R = Resistant **                    P = Positive; N = Negative             MICS are expressed in micrograms per mL    Antibiotic                 RSLT#1    RSLT#2    RSLT#3    RSLT#4 Ampicillin                     S Cefepime                       S Ceftriaxone                    S Cefuroxime                     S Ciprofloxacin                  S Ertapenem                      S Gentamicin                     S Levofloxacin                    S Meropenem                      S Nitrofurantoin                 R Piperacillin/Tazobactam        S Tetracycline                   R Tobramycin                     S Trimethoprim/Sulfa             S   Brain natriuretic peptide     Status: Abnormal   Collection Time: 05/17/19  3:40 PM  Result Value Ref Range   B Natriuretic  Peptide 291.0 (H) 0.0 - 100.0 pg/mL    Comment: Performed at [facility], [address] [place], [place] [address]  Comprehensive metabolic panel     Status: None   Collection Time: 05/17/19  3:40 PM  Result Value Ref Range   Sodium 138 135 - 145 mmol/L   Potassium 3.5 3.5 - 5.1 mmol/L    Comment: HEMOLYSIS AT THIS LEVEL MAY AFFECT RESULT   Chloride 101 98 - 111 mmol/L   CO2 24 22 - 32 mmol/L   Glucose, Bld 90 70 - 99 mg/dL   BUN 13 8 - 23 mg/dL   Creatinine, Ser 0.56 0.44 - 1.00 mg/dL   Calcium 9.2 8.9 - 10.3 mg/dL   Total Protein 6.7 6.5 - 8.1 g/dL   Albumin 3.8 3.5 - 5.0 g/dL   AST 21 15 - 41 U/L   ALT 6 0 - 44 U/L   Alkaline Phosphatase 58 38 - 126 U/L   Total Bilirubin 0.8 0.3 - 1.2 mg/dL   GFR calc non Af Amer >60 >60 mL/min   GFR calc Af Amer >60 >60 mL/min   Anion gap 13 5 - 15    Comment: Performed at [facility], [address] [place], [place] [address]  Troponin I (High Sensitivity)     Status: None   Collection Time: 05/17/19  3:40 PM  Result Value Ref Range   Troponin I (High Sensitivity) 4 <18 ng/L    Comment: (NOTE) Elevated high sensitivity troponin I (hsTnI) values and significant  changes across serial measurements may suggest ACS but many other  chronic and acute conditions are known to elevate hsTnI results.  Refer to the "Links" section for chest pain algorithms and additional  guidance. Performed at [facility], [address] [place], [place] [address]   CBC     Status: Abnormal   Collection Time: 05/17/19  8:00 PM  Result Value Ref Range   WBC 10.2 4.0 - 10.5 K/uL   RBC 3.91 3.87 -  5.11 MIL/uL   Hemoglobin 11.2 (L) 12.0 - 15.0 g/dL   HCT 34.6 (L) 36.0 - 46.0 %   MCV 88.5 80.0 - 100.0 fL   MCH 28.6 26.0 - 34.0 pg   MCHC 32.4 30.0 - 36.0 g/dL   RDW 13.6 11.5 - 15.5 %   Platelets 250 150 - 400 K/uL   nRBC 0.0 0.0 - 0.2 %    Comment: Performed at [facility], [address] [place], [place] [address]  Differential     Status: Abnormal   Collection Time: 05/17/19  8:00 PM  Result Value Ref Range   Neutrophils Relative % 51 %   Neutro Abs 5.3 1.7 - 7.7 K/uL   Lymphocytes Relative 11 %   Lymphs Abs 1.1 0.7 - 4.0 K/uL   Monocytes Relative 16 %   Monocytes Absolute 1.6 (H) 0.1 - 1.0 K/uL   Eosinophils Relative 19 %   Eosinophils Absolute 1.9 (H) 0.0 - 0.5 K/uL   Basophils Relative 1 %   Basophils Absolute 0.1 0.0 - 0.1 K/uL   Immature Granulocytes 2 %   Abs Immature Granulocytes 0.19 (H) 0.00 - 0.07 K/uL    Comment: Performed at [facility], [address], [place], [place] [address]  Bladder Scan (Post Void Residual) in office     Status: None   Collection Time: 06/06/19 11:16 AM  Result Value Ref Range   Scan Result 59ml   Urinalysis, Complete  Status: None   Collection Time: 06/06/19 11:18 AM  Result Value Ref Range   Specific Gravity, UA 1.015 1.005 - 1.030   pH, UA 7.0 5.0 - 7.5   Color, UA Yellow Yellow   Appearance Ur Clear Clear   Leukocytes,UA Negative Negative   Protein,UA Negative Negative/Trace   Glucose, UA Negative Negative   Ketones, UA Negative Negative   RBC, UA Negative Negative   Bilirubin, UA Negative Negative   Urobilinogen, Ur 0.2 0.2 - 1.0 mg/dL   Nitrite, UA Negative Negative   Microscopic Examination See below:   Microscopic Examination     Status: Abnormal   Collection Time: 06/06/19 11:18 AM   URINE  Result Value Ref Range   WBC, UA 0-5 0 - 5 /hpf   RBC None seen 0 - 2 /hpf   Epithelial Cells (non renal) 0-10 0 - 10 /hpf   Renal Epithel, UA 0-10 (A) None seen /hpf   Bacteria, UA None seen None  seen/Few  Basic metabolic panel     Status: Abnormal   Collection Time: 06/11/19  5:43 AM  Result Value Ref Range   Sodium 127 (L) 135 - 145 mmol/L   Potassium 4.7 3.5 - 5.1 mmol/L   Chloride 94 (L) 98 - 111 mmol/L   CO2 24 22 - 32 mmol/L   Glucose, Bld 78 70 - 99 mg/dL   BUN 16 8 - 23 mg/dL   Creatinine, Ser 0.58 0.44 - 1.00 mg/dL   Calcium 8.8 (L) 8.9 - 10.3 mg/dL   GFR calc non Af Amer >60 >60 mL/min   GFR calc Af Amer >60 >60 mL/min   Anion gap 9 5 - 15    Comment: Performed at [facility], [address] [place], [place] [address]    Assessment/Plan:  Essential hypertension blood pressure control important in reducing the progression of atherosclerotic disease. On appropriate oral medications.   Lymphedema Patient is clearly developed lymphedema from chronic scarring and lymphatic channels.  In addition to compression, elevation, and increasing her activity a lymphedema pump would be a good adjuvant therapy.  We are planning a venous work-up as well.  Swelling of limb I have had a long discussion with the patient regarding swelling and why it  causes symptoms.  Patient has been initiated on compression stocking therapy recently and these have helped some.  The patient will  beginning wearing the stockings first thing in the morning and removing them in the evening. The patient is instructed specifically not to sleep in the stockings.   In addition, behavioral modification will be initiated.  This will include frequent elevation, use of over the counter pain medications and exercise such as walking.  I have reviewed systemic causes for chronic edema such as liver, kidney and cardiac etiologies.  The patient denies problems with these organ systems.    Consideration for a lymph pump will also be made based upon the effectiveness of conservative therapy.  This would help to improve the edema control and prevent sequela such as ulcers and infections   Patient should  undergo duplex ultrasound of the venous system to ensure that DVT or reflux is not present.  The patient will follow-up with me after the ultrasound.        Leotis Pain 06/14/2019, 11:11 AM   This note was created with Dragon medical transcription system.  Any errors from dictation are unintentional.

## 2019-06-14 NOTE — Assessment & Plan Note (Signed)
I have had a long discussion with the patient regarding swelling and why it  causes symptoms.  Patient has been initiated on compression stocking therapy recently and these have helped some.  The patient will  beginning wearing the stockings first thing in the morning and removing them in the evening. The patient is instructed specifically not to sleep in the stockings.   In addition, behavioral modification will be initiated.  This will include frequent elevation, use of over the counter pain medications and exercise such as walking.  I have reviewed systemic causes for chronic edema such as liver, kidney and cardiac etiologies.  The patient denies problems with these organ systems.    Consideration for a lymph pump will also be made based upon the effectiveness of conservative therapy.  This would help to improve the edema control and prevent sequela such as ulcers and infections   Patient should undergo duplex ultrasound of the venous system to ensure that DVT or reflux is not present.  The patient will follow-up with me after the ultrasound.

## 2019-06-14 NOTE — Assessment & Plan Note (Signed)
Patient is clearly developed lymphedema from chronic scarring and lymphatic channels.  In addition to compression, elevation, and increasing her activity a lymphedema pump would be a good adjuvant therapy.  We are planning a venous work-up as well.

## 2019-06-14 NOTE — Patient Instructions (Signed)

## 2019-06-14 NOTE — Assessment & Plan Note (Signed)
blood pressure control important in reducing the progression of atherosclerotic disease. On appropriate oral medications.  

## 2019-06-17 DIAGNOSIS — Z20828 Contact with and (suspected) exposure to other viral communicable diseases: Secondary | ICD-10-CM | POA: Diagnosis not present

## 2019-06-22 ENCOUNTER — Other Ambulatory Visit
Admission: RE | Admit: 2019-06-22 | Discharge: 2019-06-22 | Disposition: A | Discharge status: Home / Self Care | Payer: PPO | Source: Skilled Nursing Facility | Attending: Internal Medicine | Admitting: Internal Medicine

## 2019-06-22 DIAGNOSIS — I509 Heart failure, unspecified: Secondary | ICD-10-CM | POA: Diagnosis not present

## 2019-06-22 LAB — BASIC METABOLIC PANEL
Anion gap: 10 (ref 5–15)
BUN: 25 mg/dL — ABNORMAL HIGH (ref 8–23)
CO2: 25 mmol/L (ref 22–32)
Calcium: 9.4 mg/dL (ref 8.9–10.3)
Chloride: 98 mmol/L (ref 98–111)
Creatinine, Ser: 0.77 mg/dL (ref 0.44–1.00)
GFR calc Af Amer: >60 mL/min (ref >60)
GFR calc non Af Amer: >60 mL/min (ref >60)
Glucose, Bld: 77 mg/dL (ref 70–99)
Potassium: 4.4 mmol/L (ref 3.5–5.1)
Sodium: 133 mmol/L — ABNORMAL LOW (ref 135–145)

## 2019-06-24 DIAGNOSIS — Z20828 Contact with and (suspected) exposure to other viral communicable diseases: Secondary | ICD-10-CM | POA: Diagnosis not present

## 2019-06-25 DIAGNOSIS — F419 Anxiety disorder, unspecified: Secondary | ICD-10-CM | POA: Diagnosis not present

## 2019-06-25 DIAGNOSIS — I480 Paroxysmal atrial fibrillation: Secondary | ICD-10-CM | POA: Diagnosis not present

## 2019-06-25 DIAGNOSIS — I509 Heart failure, unspecified: Secondary | ICD-10-CM | POA: Diagnosis not present

## 2019-06-28 DIAGNOSIS — Z1152 Encounter for screening for COVID-19: Secondary | ICD-10-CM | POA: Diagnosis not present

## 2019-06-28 DIAGNOSIS — N390 Urinary tract infection, site not specified: Secondary | ICD-10-CM | POA: Diagnosis not present

## 2019-06-28 DIAGNOSIS — J208 Acute bronchitis due to other specified organisms: Secondary | ICD-10-CM | POA: Diagnosis not present

## 2019-06-28 DIAGNOSIS — G20 Parkinson's disease: Secondary | ICD-10-CM | POA: Diagnosis not present

## 2019-06-28 DIAGNOSIS — Z950 Presence of cardiac pacemaker: Secondary | ICD-10-CM | POA: Diagnosis not present

## 2019-06-28 DIAGNOSIS — K219 Gastro-esophageal reflux disease without esophagitis: Secondary | ICD-10-CM | POA: Diagnosis not present

## 2019-06-28 DIAGNOSIS — U071 COVID-19: Secondary | ICD-10-CM | POA: Diagnosis not present

## 2019-06-28 DIAGNOSIS — R7989 Other specified abnormal findings of blood chemistry: Secondary | ICD-10-CM | POA: Diagnosis not present

## 2019-06-28 DIAGNOSIS — N3281 Overactive bladder: Secondary | ICD-10-CM | POA: Diagnosis not present

## 2019-06-28 DIAGNOSIS — F418 Other specified anxiety disorders: Secondary | ICD-10-CM | POA: Diagnosis not present

## 2019-06-28 DIAGNOSIS — I480 Paroxysmal atrial fibrillation: Secondary | ICD-10-CM | POA: Diagnosis not present

## 2019-06-28 DIAGNOSIS — R002 Palpitations: Secondary | ICD-10-CM | POA: Diagnosis not present

## 2019-06-28 DIAGNOSIS — R600 Localized edema: Secondary | ICD-10-CM | POA: Diagnosis not present

## 2019-06-28 DIAGNOSIS — I10 Essential (primary) hypertension: Secondary | ICD-10-CM | POA: Diagnosis not present

## 2019-06-28 DIAGNOSIS — I509 Heart failure, unspecified: Secondary | ICD-10-CM | POA: Diagnosis not present

## 2019-06-28 DIAGNOSIS — R0789 Other chest pain: Secondary | ICD-10-CM | POA: Diagnosis not present

## 2019-07-01 ENCOUNTER — Other Ambulatory Visit: Payer: Self-pay

## 2019-07-01 ENCOUNTER — Encounter (INDEPENDENT_AMBULATORY_CARE_PROVIDER_SITE_OTHER): Payer: Self-pay | Admitting: Nurse Practitioner

## 2019-07-01 ENCOUNTER — Ambulatory Visit (INDEPENDENT_AMBULATORY_CARE_PROVIDER_SITE_OTHER): Payer: PPO

## 2019-07-01 ENCOUNTER — Ambulatory Visit (INDEPENDENT_AMBULATORY_CARE_PROVIDER_SITE_OTHER): Payer: PPO | Admitting: Nurse Practitioner

## 2019-07-01 VITALS — BP 89/53 | HR 73 | Resp 17 | Ht 64.0 in | Wt 119.0 lb

## 2019-07-01 DIAGNOSIS — I509 Heart failure, unspecified: Secondary | ICD-10-CM | POA: Diagnosis not present

## 2019-07-01 DIAGNOSIS — I890 Lymphedema, not elsewhere classified: Secondary | ICD-10-CM | POA: Diagnosis not present

## 2019-07-01 DIAGNOSIS — I10 Essential (primary) hypertension: Secondary | ICD-10-CM | POA: Diagnosis not present

## 2019-07-01 DIAGNOSIS — M7989 Other specified soft tissue disorders: Secondary | ICD-10-CM

## 2019-07-01 NOTE — Progress Notes (Signed)
SUBJECTIVE:  Patient ID: Kara Levine, female    DOB: [birth year], 86 y.o.   MRN: [id number] Chief Complaint  Patient presents with  . Follow-up    ultrasound    HPI  Kara Levine is a 86 y.o. female that presents today for follow-up noninvasive studies related to lower extremity edema.  The patient initially saw Dr. Lucky Cowboy on 06/14/2019 after being referred by Dr. Saralyn Pilar for evaluation of leg swelling and lymphedema.  At the time of initial evaluation the patient still had significant edema bilaterally.  The patient had also been started on diuretic therapy with some additional changes as well.  Today, the edema is minimal.  It is also noteworthy that the patient has lost about 15 to 20 pounds over the last month or so.  The patient also notes utilizing some reflexology as well as some lymphatic massage to help with the swelling.  She also states that her legs are much less painful than they were previously, not withstanding her current neuropathy.  There has been no fever, chills nausea or vomiting.  Today noninvasive studies show there is no evidence of chronic venous insufficiency bilaterally.  There is no evidence of superficial venous thrombosis bilaterally.  There is no evidence of DVT bilaterally.  Past Medical History:  Diagnosis Date  . Anxiety   . Hypertension   . Parkinson's disease [facility])     Past Surgical History:  Procedure Laterality Date  . ABDOMINAL HYSTERECTOMY    . APPENDECTOMY    . ELBOW FRACTURE SURGERY Bilateral   . EYE SURGERY    . PACEMAKER IMPLANT      Social History   Socioeconomic History  . Marital status: Widowed    Spouse name: Not on file  . Number of children: Not on file  . Years of education: Not on file  . Highest education level: Not on file  Occupational History  . Not on file  Tobacco Use  . Smoking status: Never Smoker  . Smokeless tobacco: Never Used  Substance and Sexual Activity  . Alcohol use: Never  . Drug use:  Never  . Sexual activity: Not on file  Other Topics Concern  . Not on file  Social History Narrative  . Not on file   Social Determinants of Health   Financial Resource Strain:   . Difficulty of Paying Living Expenses: Not on file  Food Insecurity:   . Worried About Charity fundraiser in the Last Year: Not on file  . Ran Out of Food in the Last Year: Not on file  Transportation Needs:   . Lack of Transportation (Medical): Not on file  . Lack of Transportation (Non-Medical): Not on file  Physical Activity:   . Days of Exercise per Week: Not on file  . Minutes of Exercise per Session: Not on file  Stress:   . Feeling of Stress : Not on file  Social Connections:   . Frequency of Communication with Friends and Family: Not on file  . Frequency of Social Gatherings with Friends and Family: Not on file  . Attends Religious Services: Not on file  . Active Member of Clubs or Organizations: Not on file  . Attends Archivist Meetings: Not on file  . Marital Status: Not on file  Intimate Partner Violence:   . Fear of Current or Ex-Partner: Not on file  . Emotionally Abused: Not on file  . Physically Abused: Not on file  . Sexually Abused: Not  on file    Family History  Problem Relation Age of Onset  . Stroke Mother   . Hypertension Mother   . Diabetes Father   . Diabetes Sister     Allergies  Allergen Reactions  . Codeine   . Keflex [Cephalexin]   . Norco [Hydrocodone-Acetaminophen]   . Shellfish Allergy Nausea Only    Patient stated it was like food poison      Review of Systems   Review of Systems: Negative Unless Checked Constitutional: [] Weight loss  [] Fever  [] Chills Cardiac: [] Chest pain   []  Atrial Fibrillation  [] Palpitations   [] Shortness of breath when laying flat   [] Shortness of breath with exertion. [] Shortness of breath at rest Vascular:  [] Pain in legs with walking   [] Pain in legs with standing [] Pain in legs when laying flat   [] Claudication     [] Pain in feet when laying flat    [] History of DVT   [] Phlebitis   [x] Swelling in legs   [] Varicose veins   [] Non-healing ulcers Pulmonary:   [] Uses home oxygen   [] Productive cough   [] Hemoptysis   [] Wheeze  [] COPD   [] Asthma Neurologic:  [] Dizziness   [] Seizures  [] Blackouts [] History of stroke   [] History of TIA  [] Aphasia   [] Temporary Blindness   [] Weakness or numbness in arm   [x] Weakness or numbness in leg Musculoskeletal:   [] Joint swelling   [] Joint pain   [] Low back pain  []  History of Knee Replacement [] Arthritis [] back Surgeries  []  Spinal Stenosis    Hematologic:  [] Easy bruising  [] Easy bleeding   [] Hypercoagulable state   [] Anemic Gastrointestinal:  [] Diarrhea   [] Vomiting  [] Gastroesophageal reflux/heartburn   [] Difficulty swallowing. [] Abdominal pain Genitourinary:  [] Chronic kidney disease   [] Difficult urination  [] Anuric   [] Blood in urine [] Frequent urination  [] Burning with urination   [] Hematuria Skin:  [] Rashes   [] Ulcers [] Wounds Psychological:  [x] History of anxiety   []  History of major depression  []  Memory Difficulties      OBJECTIVE:   Physical Exam  BP (!) 89/53 (BP Location: Left Arm)   Pulse 73   Resp 17   Ht 5\' 4"  (1.626 m)   Wt 119 lb (54 kg)   BMI 20.43 kg/m   Gen: WD/WN, NAD Head: [place]/AT, No temporalis wasting.  Ear/Nose/Throat: Hearing grossly intact, nares w/o erythema or drainage Eyes: PER, EOMI, sclera nonicteric.  Neck: Supple, no masses.  No JVD.  Pulmonary:  Good air movement, no use of accessory muscles.  Cardiac: RRR,  Vascular:  Minimal edema bilaterally Vessel Right Left  Radial Palpable Palpable  Dorsalis Pedis  Palpable  Posterior Tibial  Palpable   Gastrointestinal: soft, non-distended. No guarding/no peritoneal signs.  Musculoskeletal: M/S 5/5 throughout.  No deformity or atrophy.  Neurologic: Pain and light touch intact in extremities.  Symmetrical.  Speech is fluent. Motor exam as listed above.  Slight tremor Psychiatric:  Judgment intact, Mood & affect appropriate for pt's clinical situation. Dermatologic:  Moderate stasis changes.. No Ulcers Noted.  No changes consistent with cellulitis. Lymph : No Cervical lymphadenopathy, no lichenification or skin changes of chronic lymphedema.       ASSESSMENT AND PLAN:  1. Lymphedema Patient has some chronic scarring the lymphatic channels due to prolonged edema.  In addition to the suggestions outlined below the patient should continue with conservative medical therapy.  She should continue to wear her compression stockings on a daily basis with elevation of her lower extremities.  She should  also attempt to exercise at least 30 minutes daily in order to help with the edema.  The patient also does have some training with reflexology as well as lymphatic massage that will also be helpful in controlling her lymphedema.  A lymphedema pump as adjuvant therapy will also be helpful for the patient as well.  The lymphedema pump paperwork has been started per the patient.  We will have her follow-up in 6 months to evaluate changes.  However, if there are issues earlier the patient should contact our office.  2. Congestive heart failure, unspecified HF chronicity, unspecified heart failure type ([place]) A great portion of the patient's edema was likely caused by fluid overload.  In reviewing the patient's weight over the last several visits, in addition to the patient reports, the patient has lost about 15 to 20 pounds in the last month or so.  This is likely attributed to the increased diuretics.  Maintaining good fluid volume status will also be essential to the patient in order to control her lower extremity edema.  This is in addition to conservative therapies.  3. Essential hypertension Patient is known to have labile blood pressures.  Today it is on the low end with a systolic of 89, however the patient does not appear to be greatly symptomatic.  Management of blood pressure is done  by cardiologist as well as PCP.  No medication changes today.   Current Outpatient Medications on File Prior to Visit  Medication Sig Dispense Refill  . bisoprolol (ZEBETA) 5 MG tablet Take 5-10 mg by mouth 2 (two) times daily.     . busPIRone (BUSPAR) 5 MG tablet Take by mouth.    . carbidopa-levodopa (SINEMET IR) 25-100 MG tablet Take by mouth.    . Cranberry-Vitamin C (AZO CRANBERRY URINARY TRACT) 250-60 MG CAPS Take by mouth 2 (two) times daily.    . furosemide (LASIX) 40 MG tablet Take 40 mg by mouth 2 (two) times daily.    . hydrALAZINE (APRESOLINE) 25 MG tablet Take 25 mg by mouth 3 (three) times daily.    . hydrocortisone 2.5 % cream     . mirabegron ER (MYRBETRIQ) 50 MG TB24 tablet Take 1 tablet (50 mg total) by mouth daily. 30 tablet 11  . nitrofurantoin (MACRODANTIN) 100 MG capsule     . omeprazole (PRILOSEC) 20 MG capsule Take by mouth.    . oxazepam (SERAX) 10 MG capsule Take 10 mg by mouth daily as needed for anxiety (raised blood pressure).    . potassium chloride (MICRO-K) 10 MEQ CR capsule Take 10 mEq by mouth daily.    Marland Kitchen spironolactone (ALDACTONE) 25 MG tablet Take 25 mg by mouth daily.     No current facility-administered medications on file prior to visit.    There are no Patient Instructions on file for this visit. No follow-ups on file.   Kris Hartmann, NP  This note was completed with Sales executive.  Any errors are purely unintentional.

## 2019-07-10 DIAGNOSIS — Z20828 Contact with and (suspected) exposure to other viral communicable diseases: Secondary | ICD-10-CM | POA: Diagnosis not present

## 2019-07-11 DIAGNOSIS — G20 Parkinson's disease: Secondary | ICD-10-CM | POA: Diagnosis not present

## 2019-07-11 DIAGNOSIS — I10 Essential (primary) hypertension: Secondary | ICD-10-CM | POA: Diagnosis not present

## 2019-07-11 DIAGNOSIS — F419 Anxiety disorder, unspecified: Secondary | ICD-10-CM | POA: Diagnosis not present

## 2019-07-11 DIAGNOSIS — I480 Paroxysmal atrial fibrillation: Secondary | ICD-10-CM | POA: Diagnosis not present

## 2019-07-16 ENCOUNTER — Other Ambulatory Visit: Payer: Self-pay | Admitting: Internal Medicine

## 2019-07-16 ENCOUNTER — Other Ambulatory Visit
Admission: RE | Admit: 2019-07-16 | Discharge: 2019-07-16 | Disposition: A | Discharge status: Home / Self Care | Payer: PPO | Source: Ambulatory Visit | Attending: Internal Medicine | Admitting: Internal Medicine

## 2019-07-16 ENCOUNTER — Emergency Department: Payer: PPO

## 2019-07-16 ENCOUNTER — Other Ambulatory Visit: Payer: Self-pay

## 2019-07-16 ENCOUNTER — Encounter
Admission: RE | Admit: 2019-07-16 | Discharge: 2019-07-16 | Disposition: A | Discharge status: Home / Self Care | Payer: PPO | Source: Ambulatory Visit | Attending: Internal Medicine | Admitting: Internal Medicine

## 2019-07-16 ENCOUNTER — Emergency Department
Admission: EM | Admit: 2019-07-16 | Discharge: 2019-07-16 | Disposition: A | Discharge status: Skilled Nursing Facility | Payer: PPO | Attending: Emergency Medicine | Admitting: Emergency Medicine

## 2019-07-16 DIAGNOSIS — U071 COVID-19: Secondary | ICD-10-CM | POA: Insufficient documentation

## 2019-07-16 DIAGNOSIS — I110 Hypertensive heart disease with heart failure: Secondary | ICD-10-CM | POA: Diagnosis not present

## 2019-07-16 DIAGNOSIS — R5381 Other malaise: Secondary | ICD-10-CM | POA: Diagnosis not present

## 2019-07-16 DIAGNOSIS — I10 Essential (primary) hypertension: Secondary | ICD-10-CM | POA: Diagnosis not present

## 2019-07-16 DIAGNOSIS — I509 Heart failure, unspecified: Secondary | ICD-10-CM | POA: Insufficient documentation

## 2019-07-16 DIAGNOSIS — R279 Unspecified lack of coordination: Secondary | ICD-10-CM | POA: Diagnosis not present

## 2019-07-16 DIAGNOSIS — R7989 Other specified abnormal findings of blood chemistry: Secondary | ICD-10-CM | POA: Diagnosis present

## 2019-07-16 DIAGNOSIS — R918 Other nonspecific abnormal finding of lung field: Secondary | ICD-10-CM | POA: Diagnosis not present

## 2019-07-16 DIAGNOSIS — R0602 Shortness of breath: Secondary | ICD-10-CM | POA: Diagnosis not present

## 2019-07-16 DIAGNOSIS — R079 Chest pain, unspecified: Secondary | ICD-10-CM

## 2019-07-16 DIAGNOSIS — G20 Parkinson's disease: Secondary | ICD-10-CM | POA: Diagnosis not present

## 2019-07-16 DIAGNOSIS — R0781 Pleurodynia: Secondary | ICD-10-CM | POA: Diagnosis not present

## 2019-07-16 DIAGNOSIS — R457 State of emotional shock and stress, unspecified: Secondary | ICD-10-CM | POA: Diagnosis not present

## 2019-07-16 DIAGNOSIS — R0789 Other chest pain: Secondary | ICD-10-CM | POA: Insufficient documentation

## 2019-07-16 DIAGNOSIS — Z743 Need for continuous supervision: Secondary | ICD-10-CM | POA: Diagnosis not present

## 2019-07-16 DIAGNOSIS — R0600 Dyspnea, unspecified: Secondary | ICD-10-CM | POA: Diagnosis not present

## 2019-07-16 DIAGNOSIS — J208 Acute bronchitis due to other specified organisms: Secondary | ICD-10-CM | POA: Diagnosis not present

## 2019-07-16 LAB — CBC WITH DIFFERENTIAL/PLATELET
Abs Immature Granulocytes: 0.02 10*3/uL (ref 0.00–0.07)
Basophils Absolute: 0 10*3/uL (ref 0.0–0.1)
Basophils Relative: 0 %
Eosinophils Absolute: 0 10*3/uL (ref 0.0–0.5)
Eosinophils Relative: 1 %
HCT: 42.2 % (ref 36.0–46.0)
Hemoglobin: 13.7 g/dL (ref 12.0–15.0)
Immature Granulocytes: 0 %
Lymphocytes Relative: 23 %
Lymphs Abs: 1.1 10*3/uL (ref 0.7–4.0)
MCH: 28.7 pg (ref 26.0–34.0)
MCHC: 32.5 g/dL (ref 30.0–36.0)
MCV: 88.5 fL (ref 80.0–100.0)
Monocytes Absolute: 0.4 10*3/uL (ref 0.1–1.0)
Monocytes Relative: 10 %
Neutro Abs: 3 10*3/uL (ref 1.7–7.7)
Neutrophils Relative %: 66 %
Platelets: 142 10*3/uL — ABNORMAL LOW (ref 150–400)
RBC: 4.77 MIL/uL (ref 3.87–5.11)
RDW: 14.2 % (ref 11.5–15.5)
WBC: 4.5 10*3/uL (ref 4.0–10.5)
nRBC: 0 % (ref 0.0–0.2)

## 2019-07-16 LAB — BASIC METABOLIC PANEL
Anion gap: 11 (ref 5–15)
BUN: 29 mg/dL — ABNORMAL HIGH (ref 8–23)
CO2: 23 mmol/L (ref 22–32)
Calcium: 9.2 mg/dL (ref 8.9–10.3)
Chloride: 100 mmol/L (ref 98–111)
Creatinine, Ser: 0.9 mg/dL (ref 0.44–1.00)
GFR calc Af Amer: >60 mL/min (ref >60)
GFR calc non Af Amer: 58 mL/min — ABNORMAL LOW (ref >60)
Glucose, Bld: 107 mg/dL — ABNORMAL HIGH (ref 70–99)
Potassium: 4.7 mmol/L (ref 3.5–5.1)
Sodium: 134 mmol/L — ABNORMAL LOW (ref 135–145)

## 2019-07-16 LAB — BRAIN NATRIURETIC PEPTIDE: B Natriuretic Peptide: 123 pg/mL — ABNORMAL HIGH (ref 0.0–100.0)

## 2019-07-16 LAB — FIBRIN DERIVATIVES D-DIMER (ARMC ONLY): Fibrin derivatives D-dimer (ARMC): 633.82 ng/mL (FEU) — ABNORMAL HIGH (ref 0.00–499.00)

## 2019-07-16 LAB — TROPONIN I (HIGH SENSITIVITY): Troponin I (High Sensitivity): 4 ng/L (ref <18)

## 2019-07-16 MED ORDER — SODIUM CHLORIDE 0.9 % IV BOLUS
250.0000 mL | Freq: Once | INTRAVENOUS | Status: AC
  Administered 2019-07-16: 250 mL via INTRAVENOUS

## 2019-07-16 MED ORDER — IOHEXOL 350 MG/ML SOLN
60.0000 mL | Freq: Once | INTRAVENOUS | Status: AC | PRN
  Administered 2019-07-16: 60 mL via INTRAVENOUS

## 2019-07-16 NOTE — ED Triage Notes (Signed)
PT to ED via EMS from [facility]. PT had labwork done at Dr's office and had elevated D-Dimer with L sided rib cage pain. PT is covid positive.

## 2019-07-16 NOTE — ED Provider Notes (Signed)
[facility] Emergency Department Provider Note       Time seen: ----------------------------------------- 3:11 PM on 07/16/2019 -----------------------------------------   I have reviewed the triage vital signs and the nursing notes.  HISTORY   Chief Complaint No chief complaint on file.    HPI Kara Levine is a 86 y.o. female with a history of anxiety, hypertension, Parkinson's disease who presents to the ED for possible PE.  Reportedly Kara Levine had elevated D-dimer in the prehospital setting and concurrently tested positive for Covid on the 13th.  Kara Levine was sent here for CT angiogram of the chest.  Oxygen saturations are 100% on room air.  Past Medical History:  Diagnosis Date  . Anxiety   . Hypertension   . Parkinson's disease Kara Levine)     Patient Active Problem List   Diagnosis Date Noted  . Essential hypertension 06/14/2019  . Lymphedema 06/14/2019  . Swelling of limb 06/14/2019  . Congestive heart failure (CHF) ([place]) 06/05/2019  . Anxiety 10/31/2018  . Paroxysmal atrial fibrillation (Kara Levine) 10/30/2018  . Heart palpitations 10/10/2018  . Primary Parkinsonism (Kara Levine) 08/07/2018  . Atypical chest pain 04/17/2018  . S/P placement of cardiac pacemaker 04/04/2018    Past Surgical History:  Procedure Laterality Date  . ABDOMINAL HYSTERECTOMY    . APPENDECTOMY    . ELBOW FRACTURE SURGERY Bilateral   . EYE SURGERY    . PACEMAKER IMPLANT      Allergies Codeine, Keflex [cephalexin], Norco [hydrocodone-acetaminophen], and Shellfish allergy  Social History Social History   Tobacco Use  . Smoking status: Never Smoker  . Smokeless tobacco: Never Used  Substance Use Topics  . Alcohol use: Never  . Drug use: Never    Review of Systems Constitutional: Negative for fever. Cardiovascular: Negative for chest pain. Respiratory: Negative for shortness of breath. Gastrointestinal: Negative for abdominal pain, vomiting and  diarrhea. Musculoskeletal: Negative for back pain. Skin: Negative for rash. Neurological: Negative for headaches, focal weakness or numbness.  All systems negative/normal/unremarkable except as stated in the HPI  ____________________________________________   PHYSICAL EXAM:  VITAL SIGNS: ED Triage Vitals  Enc Vitals Group     BP      Pulse      Resp      Temp      Temp src      SpO2      Weight      Height      Head Circumference      Peak Flow      Pain Score      Pain Loc      Pain Edu?      Excl. in [place]?    Constitutional: Alert and oriented.  Anxious, no distress Eyes: Conjunctivae are normal. Normal extraocular movements. ENT      Head: Normocephalic and atraumatic.      Nose: No congestion/rhinnorhea.      Mouth/Throat: Mucous membranes are moist.      Neck: No stridor. Cardiovascular: Rapid rate, regular rhythm. No murmurs, rubs, or gallops. Respiratory: Normal respiratory effort without tachypnea nor retractions. Breath sounds are clear and equal bilaterally. No wheezes/rales/rhonchi. Gastrointestinal: Soft and nontender. Normal bowel sounds Musculoskeletal: Nontender with normal range of motion in extremities. No lower extremity tenderness nor edema. Neurologic:  Normal speech and language. No gross focal neurologic deficits are appreciated.  Skin:  Skin is warm, dry and intact. No rash noted. Psychiatric: Mood and affect are normal. Speech and behavior are normal.  ____________________________________________  EKG: Interpreted by  me.  Sinus rhythm with rate of 72 bpm, right bundle branch block, normal axis, normal QT  ____________________________________________  ED COURSE:  As part of my medical decision making, I reviewed the following data within the [place] History obtained from family if available, nursing notes, old chart and ekg, as well as notes from prior ED visits. Patient presented for COVID-19, we will assess with labs and  imaging as indicated at this time.   Procedures  [name] was evaluated in Emergency Department on 07/16/2019 for the symptoms described in the history of present illness. Kara Levine was evaluated in the context of the global COVID-19 pandemic, which necessitated consideration that the patient might be at risk for infection with the SARS-CoV-2 virus that causes COVID-19. Institutional protocols and algorithms that pertain to the evaluation of patients at risk for COVID-19 are in a state of rapid change based on information released by regulatory bodies including the CDC and federal and state organizations. These policies and algorithms were followed during the patient's care in the ED.  ____________________________________________   LABS (pertinent positives/negatives)  Labs Reviewed  CBC WITH DIFFERENTIAL/PLATELET - Abnormal; Notable for the following components:      Result Value   Platelets 142 (*)    All other components within normal limits  BASIC METABOLIC PANEL - Abnormal; Notable for the following components:   Sodium 134 (*)    Glucose, Bld 107 (*)    BUN 29 (*)    GFR calc non Af Amer 58 (*)    All other components within normal limits  BRAIN NATRIURETIC PEPTIDE  TROPONIN I (HIGH SENSITIVITY)    RADIOLOGY Images were viewed by me  CTA chest IMPRESSION:  1. No evidence of pulmonary embolism. No acute intrathoracic  process.  2. Scattered tiny ground-glass nodules in both lungs measuring up to  3 mm. Non-contrast chest CT at 3-6 months is recommended. If nodules  persist and are stable at that time, consider additional  non-contrast chest CT examinations at 2 and 4 years. This  recommendation follows the consensus statement: Guidelines for  Management of Incidental Pulmonary Nodules Detected on CT Images:  From the Fleischner Society 2017; Radiology 2017; 284:228-243.  3. Aortic atherosclerosis (ICD10-I70.0).  ____________________________________________    DIFFERENTIAL DIAGNOSIS   COVID-19, pneumonia, PE, pneumothorax  FINAL ASSESSMENT AND PLAN  COVID-19   Plan: The patient had presented for symptoms of COVID-19. Patient's labs were reassuring. Patient's imaging was negative for PE or any other acute process and her oxygen saturations are normal.  Kara Levine is cleared for outpatient follow-up.   Laurence Aly, MD    Note: This note was generated in part or whole with voice recognition software. Voice recognition is usually quite accurate but there are transcription errors that can and very often do occur. I apologize for any typographical errors that were not detected and corrected.     Earleen Newport, MD 07/16/19 [phone]

## 2019-07-16 NOTE — ED Notes (Signed)
[facility]  CALLED  FOR  TRANSPORT 

## 2019-07-16 NOTE — ED Notes (Addendum)
Patient's discharge and follow up information reviewed with patient by ED nursing staff and patient given the opportunity to ask questions pertaining to ED visit and discharge plan of care. Patient advised that should symptoms not continue to improve, resolve entirely, or should new symptoms develop then a follow up visit with their PCP or a return visit to the ED may be warranted. Patient verbalized consent and understanding of discharge plan of care including potential need for further evaluation. Patient discharged in stable condition per attending ED physician on duty.   Pt returning to SNF via [facility]

## 2019-07-29 DIAGNOSIS — N390 Urinary tract infection, site not specified: Secondary | ICD-10-CM | POA: Diagnosis not present

## 2019-07-29 DIAGNOSIS — R1312 Dysphagia, oropharyngeal phase: Secondary | ICD-10-CM | POA: Diagnosis not present

## 2019-07-29 DIAGNOSIS — I10 Essential (primary) hypertension: Secondary | ICD-10-CM | POA: Diagnosis not present

## 2019-07-29 DIAGNOSIS — R279 Unspecified lack of coordination: Secondary | ICD-10-CM | POA: Diagnosis not present

## 2019-07-29 DIAGNOSIS — R2689 Other abnormalities of gait and mobility: Secondary | ICD-10-CM | POA: Diagnosis not present

## 2019-07-29 DIAGNOSIS — U071 COVID-19: Secondary | ICD-10-CM | POA: Diagnosis not present

## 2019-07-29 DIAGNOSIS — K219 Gastro-esophageal reflux disease without esophagitis: Secondary | ICD-10-CM | POA: Diagnosis not present

## 2019-07-29 DIAGNOSIS — R600 Localized edema: Secondary | ICD-10-CM | POA: Diagnosis not present

## 2019-07-29 DIAGNOSIS — N3281 Overactive bladder: Secondary | ICD-10-CM | POA: Diagnosis not present

## 2019-07-29 DIAGNOSIS — I480 Paroxysmal atrial fibrillation: Secondary | ICD-10-CM | POA: Diagnosis not present

## 2019-07-29 DIAGNOSIS — I509 Heart failure, unspecified: Secondary | ICD-10-CM | POA: Diagnosis not present

## 2019-07-29 DIAGNOSIS — R002 Palpitations: Secondary | ICD-10-CM | POA: Diagnosis not present

## 2019-07-29 DIAGNOSIS — J208 Acute bronchitis due to other specified organisms: Secondary | ICD-10-CM | POA: Diagnosis not present

## 2019-07-29 DIAGNOSIS — F418 Other specified anxiety disorders: Secondary | ICD-10-CM | POA: Diagnosis not present

## 2019-07-29 DIAGNOSIS — R7989 Other specified abnormal findings of blood chemistry: Secondary | ICD-10-CM | POA: Diagnosis not present

## 2019-07-29 DIAGNOSIS — R41841 Cognitive communication deficit: Secondary | ICD-10-CM | POA: Diagnosis not present

## 2019-07-29 DIAGNOSIS — M6281 Muscle weakness (generalized): Secondary | ICD-10-CM | POA: Diagnosis not present

## 2019-07-29 DIAGNOSIS — Z950 Presence of cardiac pacemaker: Secondary | ICD-10-CM | POA: Diagnosis not present

## 2019-07-29 DIAGNOSIS — G20 Parkinson's disease: Secondary | ICD-10-CM | POA: Diagnosis not present

## 2019-07-29 DIAGNOSIS — R0789 Other chest pain: Secondary | ICD-10-CM | POA: Diagnosis not present

## 2019-08-14 ENCOUNTER — Other Ambulatory Visit
Admission: RE | Admit: 2019-08-14 | Discharge: 2019-08-14 | Disposition: A | Discharge status: Home / Self Care | Payer: PPO | Source: Ambulatory Visit | Attending: Internal Medicine | Admitting: Internal Medicine

## 2019-08-14 DIAGNOSIS — R109 Unspecified abdominal pain: Secondary | ICD-10-CM | POA: Insufficient documentation

## 2019-08-14 LAB — URINALYSIS, ROUTINE W REFLEX MICROSCOPIC
Bilirubin Urine: NEGATIVE
Glucose, UA: NEGATIVE mg/dL
Hgb urine dipstick: NEGATIVE
Ketones, ur: NEGATIVE mg/dL
Leukocytes,Ua: NEGATIVE
Nitrite: NEGATIVE
Protein, ur: NEGATIVE mg/dL
Specific Gravity, Urine: 1.008 (ref 1.005–1.030)
pH: 7 (ref 5.0–8.0)

## 2019-08-15 ENCOUNTER — Encounter
Admission: RE | Admit: 2019-08-15 | Discharge: 2019-08-15 | Disposition: A | Discharge status: Home / Self Care | Payer: PPO | Source: Ambulatory Visit | Attending: Internal Medicine | Admitting: Internal Medicine

## 2019-08-15 ENCOUNTER — Inpatient Hospital Stay: Admission: RE | Admit: 2019-08-15 | Payer: PPO | Source: Ambulatory Visit | Admitting: Internal Medicine

## 2019-08-15 LAB — URINE CULTURE: Culture: <10000 — AB

## 2019-08-26 DIAGNOSIS — R002 Palpitations: Secondary | ICD-10-CM | POA: Diagnosis not present

## 2019-08-26 DIAGNOSIS — U071 COVID-19: Secondary | ICD-10-CM | POA: Diagnosis not present

## 2019-08-26 DIAGNOSIS — Z950 Presence of cardiac pacemaker: Secondary | ICD-10-CM | POA: Diagnosis not present

## 2019-08-26 DIAGNOSIS — J208 Acute bronchitis due to other specified organisms: Secondary | ICD-10-CM | POA: Diagnosis not present

## 2019-08-26 DIAGNOSIS — K219 Gastro-esophageal reflux disease without esophagitis: Secondary | ICD-10-CM | POA: Diagnosis not present

## 2019-08-26 DIAGNOSIS — R1312 Dysphagia, oropharyngeal phase: Secondary | ICD-10-CM | POA: Diagnosis not present

## 2019-08-26 DIAGNOSIS — R0789 Other chest pain: Secondary | ICD-10-CM | POA: Diagnosis not present

## 2019-08-26 DIAGNOSIS — F418 Other specified anxiety disorders: Secondary | ICD-10-CM | POA: Diagnosis not present

## 2019-08-26 DIAGNOSIS — R2689 Other abnormalities of gait and mobility: Secondary | ICD-10-CM | POA: Diagnosis not present

## 2019-08-26 DIAGNOSIS — N390 Urinary tract infection, site not specified: Secondary | ICD-10-CM | POA: Diagnosis not present

## 2019-08-26 DIAGNOSIS — G20 Parkinson's disease: Secondary | ICD-10-CM | POA: Diagnosis not present

## 2019-08-26 DIAGNOSIS — M6281 Muscle weakness (generalized): Secondary | ICD-10-CM | POA: Diagnosis not present

## 2019-08-26 DIAGNOSIS — N3281 Overactive bladder: Secondary | ICD-10-CM | POA: Diagnosis not present

## 2019-08-26 DIAGNOSIS — R600 Localized edema: Secondary | ICD-10-CM | POA: Diagnosis not present

## 2019-08-26 DIAGNOSIS — I480 Paroxysmal atrial fibrillation: Secondary | ICD-10-CM | POA: Diagnosis not present

## 2019-08-26 DIAGNOSIS — R279 Unspecified lack of coordination: Secondary | ICD-10-CM | POA: Diagnosis not present

## 2019-08-26 DIAGNOSIS — R41841 Cognitive communication deficit: Secondary | ICD-10-CM | POA: Diagnosis not present

## 2019-08-26 DIAGNOSIS — I10 Essential (primary) hypertension: Secondary | ICD-10-CM | POA: Diagnosis not present

## 2019-08-26 DIAGNOSIS — R7989 Other specified abnormal findings of blood chemistry: Secondary | ICD-10-CM | POA: Diagnosis not present

## 2019-08-26 DIAGNOSIS — I509 Heart failure, unspecified: Secondary | ICD-10-CM | POA: Diagnosis not present

## 2019-08-27 DIAGNOSIS — I509 Heart failure, unspecified: Secondary | ICD-10-CM | POA: Diagnosis not present

## 2019-08-27 DIAGNOSIS — I700 Atherosclerosis of aorta: Secondary | ICD-10-CM | POA: Diagnosis not present

## 2019-08-27 DIAGNOSIS — G20 Parkinson's disease: Secondary | ICD-10-CM | POA: Diagnosis not present

## 2019-08-27 DIAGNOSIS — I480 Paroxysmal atrial fibrillation: Secondary | ICD-10-CM | POA: Diagnosis not present

## 2019-08-29 DIAGNOSIS — I509 Heart failure, unspecified: Secondary | ICD-10-CM | POA: Diagnosis not present

## 2019-09-04 ENCOUNTER — Ambulatory Visit: Payer: PPO | Admitting: Urology

## 2019-09-08 IMAGING — CR DG CHEST 2V
1 series · 2 of 2 positions shown · non-contrast
Comparison: None.

CLINICAL DATA: Chest tightness and dizziness

EXAM:
CHEST - 2 VIEW

[Series 1: dg chest 2 view · 0.14mm/px · 2 of 2 slices shown]
[im 1/2]
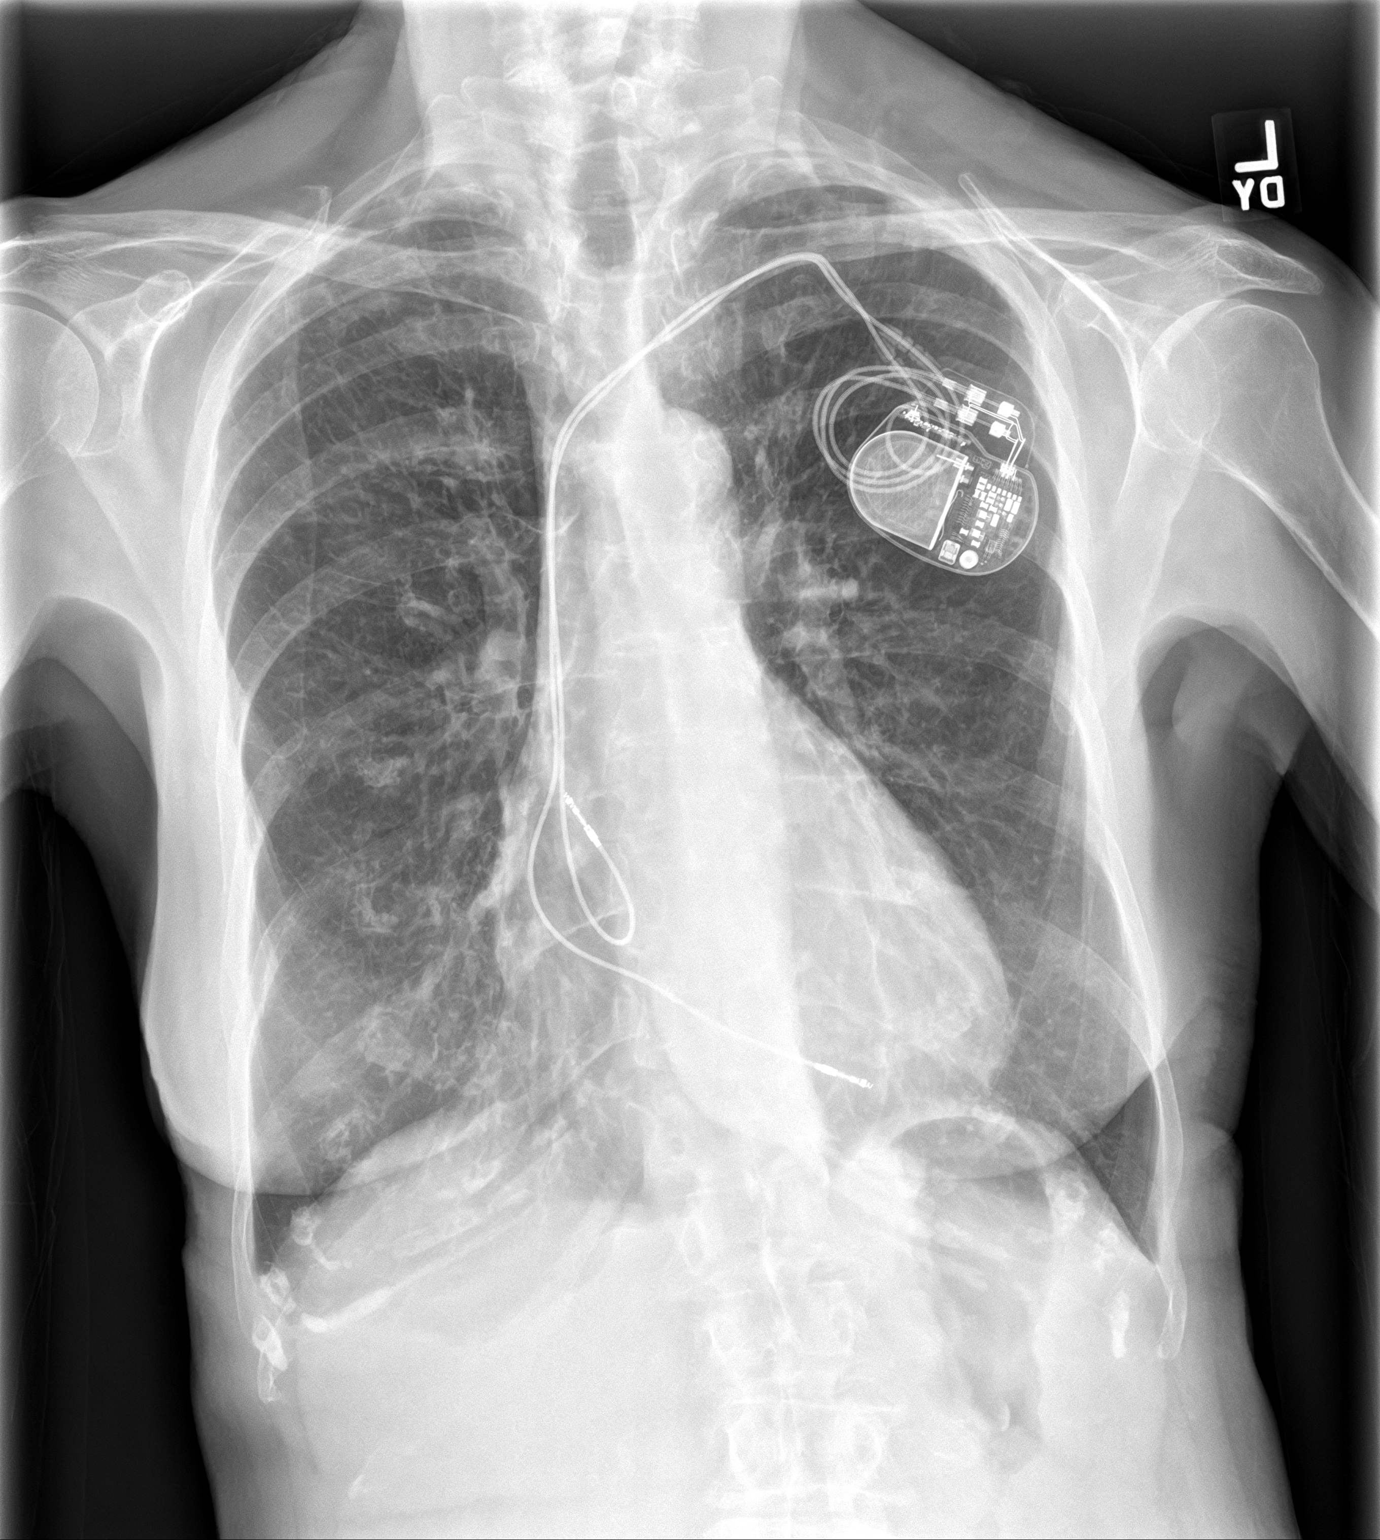
[im 2/2]
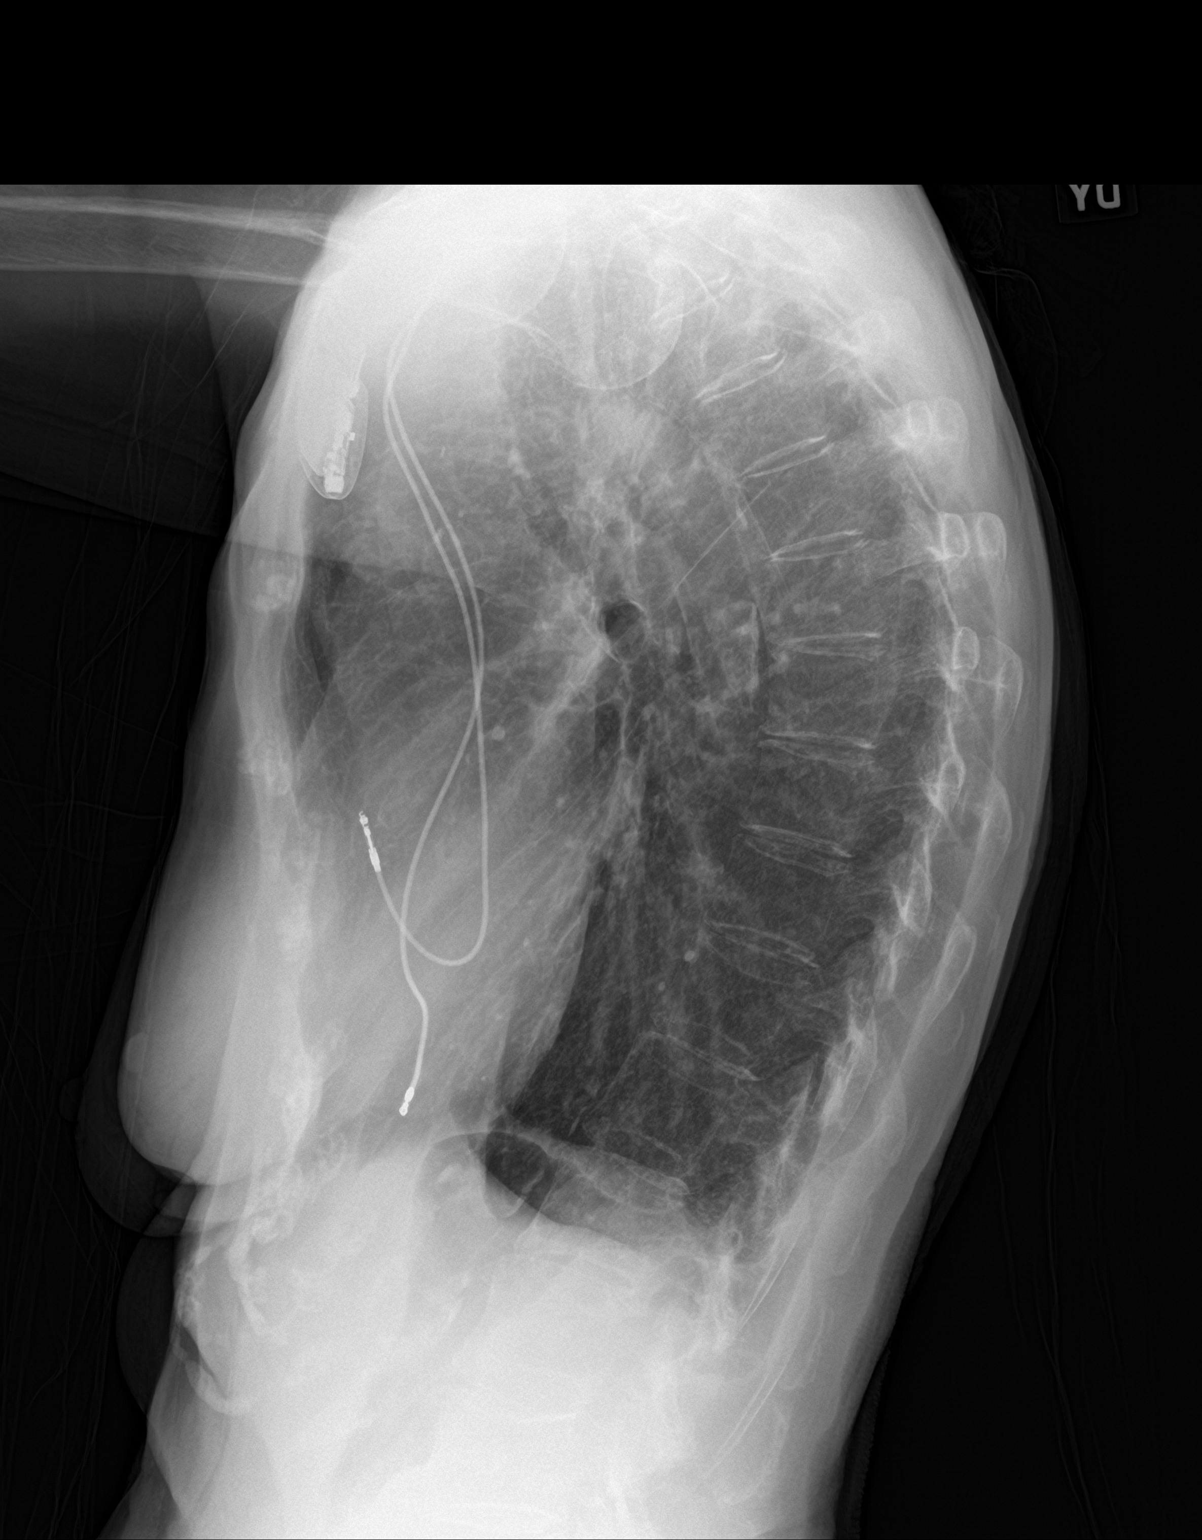

[2 of 2 positions shown; findings below may reference images not displayed]

FINDINGS: There is mild scarring in the upper lobes. There is no edema or
consolidation. Heart size and pulmonary vascularity are normal.
Pacemaker leads are attached to the right atrium and right
ventricle. No pneumothorax. No adenopathy. No bone lesions.
IMPRESSION: Mild upper lobe scarring bilaterally. No edema or consolidation.
Heart size normal. Pacemaker leads attached right atrium and right
ventricle.

## 2019-09-20 DIAGNOSIS — G20 Parkinson's disease: Secondary | ICD-10-CM | POA: Diagnosis not present

## 2019-09-20 DIAGNOSIS — R4189 Other symptoms and signs involving cognitive functions and awareness: Secondary | ICD-10-CM | POA: Diagnosis not present

## 2019-09-20 DIAGNOSIS — R2689 Other abnormalities of gait and mobility: Secondary | ICD-10-CM | POA: Diagnosis not present

## 2019-09-25 DIAGNOSIS — I509 Heart failure, unspecified: Secondary | ICD-10-CM | POA: Diagnosis not present

## 2019-09-26 DIAGNOSIS — N390 Urinary tract infection, site not specified: Secondary | ICD-10-CM | POA: Diagnosis not present

## 2019-09-26 DIAGNOSIS — R279 Unspecified lack of coordination: Secondary | ICD-10-CM | POA: Diagnosis not present

## 2019-09-26 DIAGNOSIS — I480 Paroxysmal atrial fibrillation: Secondary | ICD-10-CM | POA: Diagnosis not present

## 2019-09-26 DIAGNOSIS — R002 Palpitations: Secondary | ICD-10-CM | POA: Diagnosis not present

## 2019-09-26 DIAGNOSIS — J208 Acute bronchitis due to other specified organisms: Secondary | ICD-10-CM | POA: Diagnosis not present

## 2019-09-26 DIAGNOSIS — M6281 Muscle weakness (generalized): Secondary | ICD-10-CM | POA: Diagnosis not present

## 2019-09-26 DIAGNOSIS — R7989 Other specified abnormal findings of blood chemistry: Secondary | ICD-10-CM | POA: Diagnosis not present

## 2019-09-26 DIAGNOSIS — R0789 Other chest pain: Secondary | ICD-10-CM | POA: Diagnosis not present

## 2019-09-26 DIAGNOSIS — N3281 Overactive bladder: Secondary | ICD-10-CM | POA: Diagnosis not present

## 2019-09-26 DIAGNOSIS — I10 Essential (primary) hypertension: Secondary | ICD-10-CM | POA: Diagnosis not present

## 2019-09-26 DIAGNOSIS — K219 Gastro-esophageal reflux disease without esophagitis: Secondary | ICD-10-CM | POA: Diagnosis not present

## 2019-09-26 DIAGNOSIS — R1312 Dysphagia, oropharyngeal phase: Secondary | ICD-10-CM | POA: Diagnosis not present

## 2019-09-26 DIAGNOSIS — G20 Parkinson's disease: Secondary | ICD-10-CM | POA: Diagnosis not present

## 2019-09-26 DIAGNOSIS — I509 Heart failure, unspecified: Secondary | ICD-10-CM | POA: Diagnosis not present

## 2019-09-26 DIAGNOSIS — R41841 Cognitive communication deficit: Secondary | ICD-10-CM | POA: Diagnosis not present

## 2019-09-26 DIAGNOSIS — R2689 Other abnormalities of gait and mobility: Secondary | ICD-10-CM | POA: Diagnosis not present

## 2019-09-26 DIAGNOSIS — Z950 Presence of cardiac pacemaker: Secondary | ICD-10-CM | POA: Diagnosis not present

## 2019-09-26 DIAGNOSIS — F418 Other specified anxiety disorders: Secondary | ICD-10-CM | POA: Diagnosis not present

## 2019-09-26 DIAGNOSIS — R600 Localized edema: Secondary | ICD-10-CM | POA: Diagnosis not present

## 2019-09-26 DIAGNOSIS — U071 COVID-19: Secondary | ICD-10-CM | POA: Diagnosis not present

## 2019-10-07 ENCOUNTER — Other Ambulatory Visit
Admission: RE | Admit: 2019-10-07 | Discharge: 2019-10-07 | Disposition: A | Discharge status: Home / Self Care | Payer: PPO | Source: Ambulatory Visit | Attending: Internal Medicine | Admitting: Internal Medicine

## 2019-10-07 ENCOUNTER — Other Ambulatory Visit: Payer: Self-pay | Admitting: Oncology

## 2019-10-07 DIAGNOSIS — E876 Hypokalemia: Secondary | ICD-10-CM | POA: Diagnosis not present

## 2019-10-07 DIAGNOSIS — R42 Dizziness and giddiness: Secondary | ICD-10-CM | POA: Diagnosis not present

## 2019-10-07 DIAGNOSIS — R911 Solitary pulmonary nodule: Secondary | ICD-10-CM

## 2019-10-07 DIAGNOSIS — R531 Weakness: Secondary | ICD-10-CM | POA: Insufficient documentation

## 2019-10-07 LAB — BASIC METABOLIC PANEL
Anion gap: 7 (ref 5–15)
BUN: 27 mg/dL — ABNORMAL HIGH (ref 8–23)
CO2: 27 mmol/L (ref 22–32)
Calcium: 9.1 mg/dL (ref 8.9–10.3)
Chloride: 98 mmol/L (ref 98–111)
Creatinine, Ser: 0.87 mg/dL (ref 0.44–1.00)
GFR calc Af Amer: >60 mL/min (ref >60)
GFR calc non Af Amer: >60 mL/min (ref >60)
Glucose, Bld: 76 mg/dL (ref 70–99)
Potassium: 4.1 mmol/L (ref 3.5–5.1)
Sodium: 132 mmol/L — ABNORMAL LOW (ref 135–145)

## 2019-10-07 NOTE — Progress Notes (Signed)
  Pulmonary Nodule Clinic Telephone Note [place]   Received referral from Dr. Carrie Mew, PCP.  HPI: Patient is a 86 year old female with past medical history significant for anxiety, hypertension, Parkinson's disease who presented to the emergency room in January 2021 for left-sided chest pain and shortness of breath with an elevated D-dimer.  She was Covid positive.  Imaging was negative for PE but did reveal scattered tiny groundglass nodules in both lungs measuring up to 3 mm in size.  A noncontrast chest CT was recommended in 3 to 6 months for further evaluation.  If nodules persist and are stable, could consider additional noncontrast chest CT at 2 and 4 years.  Review and Recommendations: I personally reviewed all patient's previous imaging including most recent CTA as detailed above.  Reviewed chest x-ray from 07/16/2019 and 06/22/2018.   I recommend follow-up with noncontrast CT scan of the chest in approximately 3-6 from previous.   Social History: Patient is a non-smoker and has never smoked.  High risk factors include: History of heavy smoking, exposure to asbestos, radium or uranium, personal family history of lung cancer, older age, sex (females greater than males), race (black and native [place] greater than weight), marginal speculation, upper lobe location, multiplicity (less than 5 nodules increases risk for malignancy) and emphysema and/or pulmonary fibrosis.   This recommendation follows the consensus statement: Guidelines for Management of Incidental Pulmonary Nodules Detected on CT Images: From the Fleischner Society 2017; Radiology 2017; 284:228-243.    I have placed order for CT scan without contrast to be completed in 2 to 3 months.  Disposition: Order placed for repeat CT chest. Will notify Lenox Ponds in scheduling. [place] to call patient with appointment date and time. Return to pulmonary nodule clinic a few days after his repeat imaging to  discuss results and plan moving forward.  Faythe Casa, NP 10/07/2019 1:24 PM

## 2019-10-11 ENCOUNTER — Telehealth: Payer: Self-pay | Admitting: *Deleted

## 2019-10-11 NOTE — Telephone Encounter (Signed)
Referral received for pt to be seen in Lung Nodule Clinic for further workup of incidental lung nodule with follow up CT scan. Left message with patient to call back to discuss clinic and review recommendations and upcoming appts including follow up CT scan and visit with Jenny Burns, NP to discuss results. Awaiting call back.  

## 2019-10-16 ENCOUNTER — Telehealth: Payer: Self-pay | Admitting: *Deleted

## 2019-10-17 NOTE — Telephone Encounter (Signed)
Call received from pt's son that he discussed upcoming appts for CT scan and follow up in the lung nodule clinic. Per pt's son, Lanny Hurst, pt decided that she does not wish to have follow up CT scan in the lung nodule clinic. Appts have been cancelled and will forward note to pt's PCP. Nothing further needed at this time.

## 2019-10-22 DIAGNOSIS — I509 Heart failure, unspecified: Secondary | ICD-10-CM | POA: Diagnosis not present

## 2019-10-22 DIAGNOSIS — U071 COVID-19: Secondary | ICD-10-CM | POA: Diagnosis not present

## 2019-10-22 DIAGNOSIS — G20 Parkinson's disease: Secondary | ICD-10-CM | POA: Diagnosis not present

## 2019-10-22 DIAGNOSIS — I700 Atherosclerosis of aorta: Secondary | ICD-10-CM | POA: Diagnosis not present

## 2019-10-22 DIAGNOSIS — I480 Paroxysmal atrial fibrillation: Secondary | ICD-10-CM | POA: Diagnosis not present

## 2019-10-22 DIAGNOSIS — J208 Acute bronchitis due to other specified organisms: Secondary | ICD-10-CM | POA: Diagnosis not present

## 2019-10-22 DIAGNOSIS — I10 Essential (primary) hypertension: Secondary | ICD-10-CM | POA: Diagnosis not present

## 2019-10-28 DIAGNOSIS — R1312 Dysphagia, oropharyngeal phase: Secondary | ICD-10-CM | POA: Diagnosis not present

## 2019-10-28 DIAGNOSIS — R7989 Other specified abnormal findings of blood chemistry: Secondary | ICD-10-CM | POA: Diagnosis not present

## 2019-10-28 DIAGNOSIS — R0789 Other chest pain: Secondary | ICD-10-CM | POA: Diagnosis not present

## 2019-10-28 DIAGNOSIS — N3281 Overactive bladder: Secondary | ICD-10-CM | POA: Diagnosis not present

## 2019-10-28 DIAGNOSIS — R2689 Other abnormalities of gait and mobility: Secondary | ICD-10-CM | POA: Diagnosis not present

## 2019-10-28 DIAGNOSIS — M6281 Muscle weakness (generalized): Secondary | ICD-10-CM | POA: Diagnosis not present

## 2019-10-28 DIAGNOSIS — I509 Heart failure, unspecified: Secondary | ICD-10-CM | POA: Diagnosis not present

## 2019-10-28 DIAGNOSIS — I10 Essential (primary) hypertension: Secondary | ICD-10-CM | POA: Diagnosis not present

## 2019-10-28 DIAGNOSIS — R002 Palpitations: Secondary | ICD-10-CM | POA: Diagnosis not present

## 2019-10-28 DIAGNOSIS — K219 Gastro-esophageal reflux disease without esophagitis: Secondary | ICD-10-CM | POA: Diagnosis not present

## 2019-10-28 DIAGNOSIS — R41841 Cognitive communication deficit: Secondary | ICD-10-CM | POA: Diagnosis not present

## 2019-10-28 DIAGNOSIS — Z950 Presence of cardiac pacemaker: Secondary | ICD-10-CM | POA: Diagnosis not present

## 2019-10-28 DIAGNOSIS — N390 Urinary tract infection, site not specified: Secondary | ICD-10-CM | POA: Diagnosis not present

## 2019-10-28 DIAGNOSIS — R279 Unspecified lack of coordination: Secondary | ICD-10-CM | POA: Diagnosis not present

## 2019-10-28 DIAGNOSIS — G20 Parkinson's disease: Secondary | ICD-10-CM | POA: Diagnosis not present

## 2019-10-28 DIAGNOSIS — R600 Localized edema: Secondary | ICD-10-CM | POA: Diagnosis not present

## 2019-10-28 DIAGNOSIS — I480 Paroxysmal atrial fibrillation: Secondary | ICD-10-CM | POA: Diagnosis not present

## 2019-10-28 DIAGNOSIS — J208 Acute bronchitis due to other specified organisms: Secondary | ICD-10-CM | POA: Diagnosis not present

## 2019-10-28 DIAGNOSIS — U071 COVID-19: Secondary | ICD-10-CM | POA: Diagnosis not present

## 2019-10-28 DIAGNOSIS — F418 Other specified anxiety disorders: Secondary | ICD-10-CM | POA: Diagnosis not present

## 2019-10-30 ENCOUNTER — Encounter
Admission: RE | Admit: 2019-10-30 | Discharge: 2019-10-30 | Disposition: A | Discharge status: Home / Self Care | Payer: PPO | Source: Ambulatory Visit | Attending: Internal Medicine | Admitting: Internal Medicine

## 2019-11-05 ENCOUNTER — Other Ambulatory Visit
Admission: RE | Admit: 2019-11-05 | Discharge: 2019-11-05 | Disposition: A | Discharge status: Home / Self Care | Payer: PPO | Source: Ambulatory Visit | Attending: Internal Medicine | Admitting: Internal Medicine

## 2019-11-05 DIAGNOSIS — Z01812 Encounter for preprocedural laboratory examination: Secondary | ICD-10-CM | POA: Diagnosis not present

## 2019-11-05 LAB — URINALYSIS, COMPLETE (UACMP) WITH MICROSCOPIC
Bacteria, UA: NONE SEEN
Bilirubin Urine: NEGATIVE
Glucose, UA: NEGATIVE mg/dL
Ketones, ur: NEGATIVE mg/dL
Nitrite: NEGATIVE
Protein, ur: 100 mg/dL — AB
Specific Gravity, Urine: 1.012 (ref 1.005–1.030)
WBC, UA: >50 WBC/hpf — ABNORMAL HIGH (ref 0–5)
pH: 9 — ABNORMAL HIGH (ref 5.0–8.0)

## 2019-11-07 LAB — URINE CULTURE: Culture: >=100000 — AB

## 2019-11-19 ENCOUNTER — Ambulatory Visit: Payer: PPO | Attending: Oncology

## 2019-11-19 ENCOUNTER — Other Ambulatory Visit: Payer: PPO

## 2019-11-20 ENCOUNTER — Ambulatory Visit: Payer: PPO | Admitting: Oncology

## 2019-11-26 DIAGNOSIS — I509 Heart failure, unspecified: Secondary | ICD-10-CM | POA: Diagnosis not present

## 2019-11-26 DIAGNOSIS — N3281 Overactive bladder: Secondary | ICD-10-CM | POA: Diagnosis not present

## 2019-11-26 DIAGNOSIS — I10 Essential (primary) hypertension: Secondary | ICD-10-CM | POA: Diagnosis not present

## 2019-11-26 DIAGNOSIS — R41841 Cognitive communication deficit: Secondary | ICD-10-CM | POA: Diagnosis not present

## 2019-11-26 DIAGNOSIS — R7989 Other specified abnormal findings of blood chemistry: Secondary | ICD-10-CM | POA: Diagnosis not present

## 2019-11-26 DIAGNOSIS — Z950 Presence of cardiac pacemaker: Secondary | ICD-10-CM | POA: Diagnosis not present

## 2019-11-26 DIAGNOSIS — N390 Urinary tract infection, site not specified: Secondary | ICD-10-CM | POA: Diagnosis not present

## 2019-11-26 DIAGNOSIS — R279 Unspecified lack of coordination: Secondary | ICD-10-CM | POA: Diagnosis not present

## 2019-11-26 DIAGNOSIS — R2689 Other abnormalities of gait and mobility: Secondary | ICD-10-CM | POA: Diagnosis not present

## 2019-11-26 DIAGNOSIS — M6281 Muscle weakness (generalized): Secondary | ICD-10-CM | POA: Diagnosis not present

## 2019-11-26 DIAGNOSIS — U071 COVID-19: Secondary | ICD-10-CM | POA: Diagnosis not present

## 2019-11-26 DIAGNOSIS — R600 Localized edema: Secondary | ICD-10-CM | POA: Diagnosis not present

## 2019-11-26 DIAGNOSIS — F418 Other specified anxiety disorders: Secondary | ICD-10-CM | POA: Diagnosis not present

## 2019-11-26 DIAGNOSIS — R002 Palpitations: Secondary | ICD-10-CM | POA: Diagnosis not present

## 2019-11-26 DIAGNOSIS — R0789 Other chest pain: Secondary | ICD-10-CM | POA: Diagnosis not present

## 2019-11-26 DIAGNOSIS — K219 Gastro-esophageal reflux disease without esophagitis: Secondary | ICD-10-CM | POA: Diagnosis not present

## 2019-11-26 DIAGNOSIS — I480 Paroxysmal atrial fibrillation: Secondary | ICD-10-CM | POA: Diagnosis not present

## 2019-11-26 DIAGNOSIS — G20 Parkinson's disease: Secondary | ICD-10-CM | POA: Diagnosis not present

## 2019-11-26 DIAGNOSIS — J208 Acute bronchitis due to other specified organisms: Secondary | ICD-10-CM | POA: Diagnosis not present

## 2019-11-26 DIAGNOSIS — R1312 Dysphagia, oropharyngeal phase: Secondary | ICD-10-CM | POA: Diagnosis not present

## 2019-12-03 ENCOUNTER — Ambulatory Visit (INDEPENDENT_AMBULATORY_CARE_PROVIDER_SITE_OTHER): Payer: PPO | Admitting: Vascular Surgery

## 2019-12-11 ENCOUNTER — Encounter
Admission: RE | Admit: 2019-12-11 | Discharge: 2019-12-11 | Disposition: A | Discharge status: Home / Self Care | Payer: PPO | Source: Ambulatory Visit | Attending: Internal Medicine | Admitting: Internal Medicine

## 2019-12-21 DIAGNOSIS — M533 Sacrococcygeal disorders, not elsewhere classified: Secondary | ICD-10-CM | POA: Diagnosis not present

## 2019-12-21 DIAGNOSIS — M25551 Pain in right hip: Secondary | ICD-10-CM | POA: Diagnosis not present

## 2019-12-21 DIAGNOSIS — M25511 Pain in right shoulder: Secondary | ICD-10-CM | POA: Diagnosis not present

## 2019-12-23 DIAGNOSIS — Z789 Other specified health status: Secondary | ICD-10-CM | POA: Diagnosis not present

## 2019-12-23 DIAGNOSIS — I700 Atherosclerosis of aorta: Secondary | ICD-10-CM | POA: Diagnosis not present

## 2019-12-23 DIAGNOSIS — I509 Heart failure, unspecified: Secondary | ICD-10-CM | POA: Diagnosis not present

## 2019-12-23 DIAGNOSIS — Z0000 Encounter for general adult medical examination without abnormal findings: Secondary | ICD-10-CM | POA: Diagnosis not present

## 2019-12-23 DIAGNOSIS — G20 Parkinson's disease: Secondary | ICD-10-CM | POA: Diagnosis not present

## 2019-12-23 DIAGNOSIS — I480 Paroxysmal atrial fibrillation: Secondary | ICD-10-CM | POA: Diagnosis not present

## 2019-12-26 DIAGNOSIS — K219 Gastro-esophageal reflux disease without esophagitis: Secondary | ICD-10-CM | POA: Diagnosis not present

## 2019-12-26 DIAGNOSIS — N390 Urinary tract infection, site not specified: Secondary | ICD-10-CM | POA: Diagnosis not present

## 2019-12-26 DIAGNOSIS — R1312 Dysphagia, oropharyngeal phase: Secondary | ICD-10-CM | POA: Diagnosis not present

## 2019-12-26 DIAGNOSIS — R7989 Other specified abnormal findings of blood chemistry: Secondary | ICD-10-CM | POA: Diagnosis not present

## 2019-12-26 DIAGNOSIS — G20 Parkinson's disease: Secondary | ICD-10-CM | POA: Diagnosis not present

## 2019-12-26 DIAGNOSIS — R002 Palpitations: Secondary | ICD-10-CM | POA: Diagnosis not present

## 2019-12-26 DIAGNOSIS — R600 Localized edema: Secondary | ICD-10-CM | POA: Diagnosis not present

## 2019-12-26 DIAGNOSIS — I480 Paroxysmal atrial fibrillation: Secondary | ICD-10-CM | POA: Diagnosis not present

## 2019-12-26 DIAGNOSIS — R2689 Other abnormalities of gait and mobility: Secondary | ICD-10-CM | POA: Diagnosis not present

## 2019-12-26 DIAGNOSIS — M6281 Muscle weakness (generalized): Secondary | ICD-10-CM | POA: Diagnosis not present

## 2019-12-26 DIAGNOSIS — R0789 Other chest pain: Secondary | ICD-10-CM | POA: Diagnosis not present

## 2019-12-26 DIAGNOSIS — Z950 Presence of cardiac pacemaker: Secondary | ICD-10-CM | POA: Diagnosis not present

## 2019-12-26 DIAGNOSIS — U071 COVID-19: Secondary | ICD-10-CM | POA: Diagnosis not present

## 2019-12-26 DIAGNOSIS — I10 Essential (primary) hypertension: Secondary | ICD-10-CM | POA: Diagnosis not present

## 2019-12-26 DIAGNOSIS — I509 Heart failure, unspecified: Secondary | ICD-10-CM | POA: Diagnosis not present

## 2019-12-26 DIAGNOSIS — N3281 Overactive bladder: Secondary | ICD-10-CM | POA: Diagnosis not present

## 2019-12-26 DIAGNOSIS — J208 Acute bronchitis due to other specified organisms: Secondary | ICD-10-CM | POA: Diagnosis not present

## 2019-12-26 DIAGNOSIS — F418 Other specified anxiety disorders: Secondary | ICD-10-CM | POA: Diagnosis not present

## 2019-12-26 DIAGNOSIS — R279 Unspecified lack of coordination: Secondary | ICD-10-CM | POA: Diagnosis not present

## 2019-12-26 DIAGNOSIS — R41841 Cognitive communication deficit: Secondary | ICD-10-CM | POA: Diagnosis not present

## 2020-01-13 DIAGNOSIS — R002 Palpitations: Secondary | ICD-10-CM | POA: Diagnosis not present

## 2020-01-13 DIAGNOSIS — Z950 Presence of cardiac pacemaker: Secondary | ICD-10-CM | POA: Diagnosis not present

## 2020-01-13 DIAGNOSIS — I10 Essential (primary) hypertension: Secondary | ICD-10-CM | POA: Diagnosis not present

## 2020-01-13 DIAGNOSIS — F419 Anxiety disorder, unspecified: Secondary | ICD-10-CM | POA: Diagnosis not present

## 2020-01-14 DIAGNOSIS — F320 Major depressive disorder, single episode, mild: Secondary | ICD-10-CM | POA: Diagnosis not present

## 2020-01-14 DIAGNOSIS — Z789 Other specified health status: Secondary | ICD-10-CM | POA: Diagnosis not present

## 2020-01-15 DIAGNOSIS — L821 Other seborrheic keratosis: Secondary | ICD-10-CM | POA: Diagnosis not present

## 2020-01-15 DIAGNOSIS — D485 Neoplasm of uncertain behavior of skin: Secondary | ICD-10-CM | POA: Diagnosis not present

## 2020-01-15 DIAGNOSIS — C44311 Basal cell carcinoma of skin of nose: Secondary | ICD-10-CM | POA: Diagnosis not present

## 2020-01-27 DIAGNOSIS — R1312 Dysphagia, oropharyngeal phase: Secondary | ICD-10-CM | POA: Diagnosis not present

## 2020-01-27 DIAGNOSIS — M6281 Muscle weakness (generalized): Secondary | ICD-10-CM | POA: Diagnosis not present

## 2020-01-27 DIAGNOSIS — R7989 Other specified abnormal findings of blood chemistry: Secondary | ICD-10-CM | POA: Diagnosis not present

## 2020-01-27 DIAGNOSIS — G20 Parkinson's disease: Secondary | ICD-10-CM | POA: Diagnosis not present

## 2020-01-27 DIAGNOSIS — J208 Acute bronchitis due to other specified organisms: Secondary | ICD-10-CM | POA: Diagnosis not present

## 2020-01-27 DIAGNOSIS — F418 Other specified anxiety disorders: Secondary | ICD-10-CM | POA: Diagnosis not present

## 2020-01-27 DIAGNOSIS — R2689 Other abnormalities of gait and mobility: Secondary | ICD-10-CM | POA: Diagnosis not present

## 2020-01-27 DIAGNOSIS — R41841 Cognitive communication deficit: Secondary | ICD-10-CM | POA: Diagnosis not present

## 2020-01-27 DIAGNOSIS — R600 Localized edema: Secondary | ICD-10-CM | POA: Diagnosis not present

## 2020-01-27 DIAGNOSIS — R279 Unspecified lack of coordination: Secondary | ICD-10-CM | POA: Diagnosis not present

## 2020-01-27 DIAGNOSIS — R0789 Other chest pain: Secondary | ICD-10-CM | POA: Diagnosis not present

## 2020-01-30 ENCOUNTER — Other Ambulatory Visit: Payer: Self-pay | Admitting: Internal Medicine

## 2020-01-30 ENCOUNTER — Emergency Department: Payer: PPO

## 2020-01-30 ENCOUNTER — Inpatient Hospital Stay
Admission: EM | Admit: 2020-01-30 | Discharge: 2020-02-03 | DRG: 481 | Disposition: A | Discharge status: Hospice | Payer: PPO | Source: Skilled Nursing Facility | Attending: Hospitalist | Admitting: Hospitalist

## 2020-01-30 DIAGNOSIS — G20 Parkinson's disease: Secondary | ICD-10-CM

## 2020-01-30 DIAGNOSIS — S0990XA Unspecified injury of head, initial encounter: Secondary | ICD-10-CM | POA: Diagnosis not present

## 2020-01-30 DIAGNOSIS — Z823 Family history of stroke: Secondary | ICD-10-CM | POA: Diagnosis not present

## 2020-01-30 DIAGNOSIS — Z90710 Acquired absence of both cervix and uterus: Secondary | ICD-10-CM | POA: Diagnosis not present

## 2020-01-30 DIAGNOSIS — Z681 Body mass index (BMI) 19 or less, adult: Secondary | ICD-10-CM | POA: Diagnosis not present

## 2020-01-30 DIAGNOSIS — K219 Gastro-esophageal reflux disease without esophagitis: Secondary | ICD-10-CM | POA: Diagnosis present

## 2020-01-30 DIAGNOSIS — E871 Hypo-osmolality and hyponatremia: Secondary | ICD-10-CM | POA: Diagnosis present

## 2020-01-30 DIAGNOSIS — S7221XA Displaced subtrochanteric fracture of right femur, initial encounter for closed fracture: Principal | ICD-10-CM | POA: Diagnosis present

## 2020-01-30 DIAGNOSIS — G629 Polyneuropathy, unspecified: Secondary | ICD-10-CM | POA: Diagnosis present

## 2020-01-30 DIAGNOSIS — F419 Anxiety disorder, unspecified: Secondary | ICD-10-CM | POA: Diagnosis present

## 2020-01-30 DIAGNOSIS — R52 Pain, unspecified: Secondary | ICD-10-CM | POA: Diagnosis not present

## 2020-01-30 DIAGNOSIS — M1611 Unilateral primary osteoarthritis, right hip: Secondary | ICD-10-CM | POA: Diagnosis not present

## 2020-01-30 DIAGNOSIS — R636 Underweight: Secondary | ICD-10-CM | POA: Diagnosis present

## 2020-01-30 DIAGNOSIS — Z66 Do not resuscitate: Secondary | ICD-10-CM | POA: Diagnosis not present

## 2020-01-30 DIAGNOSIS — R079 Chest pain, unspecified: Secondary | ICD-10-CM | POA: Diagnosis not present

## 2020-01-30 DIAGNOSIS — I4510 Unspecified right bundle-branch block: Secondary | ICD-10-CM | POA: Diagnosis present

## 2020-01-30 DIAGNOSIS — D649 Anemia, unspecified: Secondary | ICD-10-CM | POA: Diagnosis not present

## 2020-01-30 DIAGNOSIS — D72829 Elevated white blood cell count, unspecified: Secondary | ICD-10-CM | POA: Diagnosis present

## 2020-01-30 DIAGNOSIS — Z8249 Family history of ischemic heart disease and other diseases of the circulatory system: Secondary | ICD-10-CM

## 2020-01-30 DIAGNOSIS — R1312 Dysphagia, oropharyngeal phase: Secondary | ICD-10-CM

## 2020-01-30 DIAGNOSIS — I495 Sick sinus syndrome: Secondary | ICD-10-CM | POA: Diagnosis present

## 2020-01-30 DIAGNOSIS — Z833 Family history of diabetes mellitus: Secondary | ICD-10-CM

## 2020-01-30 DIAGNOSIS — W1830XA Fall on same level, unspecified, initial encounter: Secondary | ICD-10-CM | POA: Diagnosis present

## 2020-01-30 DIAGNOSIS — Z950 Presence of cardiac pacemaker: Secondary | ICD-10-CM

## 2020-01-30 DIAGNOSIS — S0101XA Laceration without foreign body of scalp, initial encounter: Secondary | ICD-10-CM | POA: Diagnosis present

## 2020-01-30 DIAGNOSIS — K8020 Calculus of gallbladder without cholecystitis without obstruction: Secondary | ICD-10-CM | POA: Diagnosis not present

## 2020-01-30 DIAGNOSIS — I110 Hypertensive heart disease with heart failure: Secondary | ICD-10-CM | POA: Diagnosis not present

## 2020-01-30 DIAGNOSIS — Z01818 Encounter for other preprocedural examination: Secondary | ICD-10-CM

## 2020-01-30 DIAGNOSIS — F0280 Dementia in other diseases classified elsewhere without behavioral disturbance: Secondary | ICD-10-CM | POA: Diagnosis present

## 2020-01-30 DIAGNOSIS — S7291XA Unspecified fracture of right femur, initial encounter for closed fracture: Secondary | ICD-10-CM

## 2020-01-30 DIAGNOSIS — Z885 Allergy status to narcotic agent status: Secondary | ICD-10-CM

## 2020-01-30 DIAGNOSIS — S01312A Laceration without foreign body of left ear, initial encounter: Secondary | ICD-10-CM | POA: Diagnosis not present

## 2020-01-30 DIAGNOSIS — S79929A Unspecified injury of unspecified thigh, initial encounter: Secondary | ICD-10-CM | POA: Diagnosis not present

## 2020-01-30 DIAGNOSIS — I509 Heart failure, unspecified: Secondary | ICD-10-CM | POA: Diagnosis not present

## 2020-01-30 DIAGNOSIS — Z7401 Bed confinement status: Secondary | ICD-10-CM | POA: Diagnosis not present

## 2020-01-30 DIAGNOSIS — S01311A Laceration without foreign body of right ear, initial encounter: Secondary | ICD-10-CM | POA: Diagnosis present

## 2020-01-30 DIAGNOSIS — Z888 Allergy status to other drugs, medicaments and biological substances status: Secondary | ICD-10-CM | POA: Diagnosis not present

## 2020-01-30 DIAGNOSIS — F329 Major depressive disorder, single episode, unspecified: Secondary | ICD-10-CM | POA: Diagnosis present

## 2020-01-30 DIAGNOSIS — R404 Transient alteration of awareness: Secondary | ICD-10-CM | POA: Diagnosis not present

## 2020-01-30 DIAGNOSIS — R600 Localized edema: Secondary | ICD-10-CM | POA: Diagnosis not present

## 2020-01-30 DIAGNOSIS — R0902 Hypoxemia: Secondary | ICD-10-CM | POA: Diagnosis not present

## 2020-01-30 DIAGNOSIS — Z91013 Allergy to seafood: Secondary | ICD-10-CM

## 2020-01-30 DIAGNOSIS — I10 Essential (primary) hypertension: Secondary | ICD-10-CM | POA: Diagnosis not present

## 2020-01-30 DIAGNOSIS — W19XXXA Unspecified fall, initial encounter: Secondary | ICD-10-CM | POA: Diagnosis not present

## 2020-01-30 DIAGNOSIS — Z79899 Other long term (current) drug therapy: Secondary | ICD-10-CM | POA: Diagnosis not present

## 2020-01-30 DIAGNOSIS — S3991XA Unspecified injury of abdomen, initial encounter: Secondary | ICD-10-CM | POA: Diagnosis not present

## 2020-01-30 DIAGNOSIS — Z20822 Contact with and (suspected) exposure to covid-19: Secondary | ICD-10-CM | POA: Diagnosis present

## 2020-01-30 DIAGNOSIS — Z9889 Other specified postprocedural states: Secondary | ICD-10-CM | POA: Diagnosis not present

## 2020-01-30 DIAGNOSIS — Z419 Encounter for procedure for purposes other than remedying health state, unspecified: Secondary | ICD-10-CM

## 2020-01-30 DIAGNOSIS — I70201 Unspecified atherosclerosis of native arteries of extremities, right leg: Secondary | ICD-10-CM | POA: Diagnosis not present

## 2020-01-30 DIAGNOSIS — Z515 Encounter for palliative care: Secondary | ICD-10-CM | POA: Diagnosis not present

## 2020-01-30 DIAGNOSIS — S72001A Fracture of unspecified part of neck of right femur, initial encounter for closed fracture: Secondary | ICD-10-CM | POA: Diagnosis present

## 2020-01-30 DIAGNOSIS — K828 Other specified diseases of gallbladder: Secondary | ICD-10-CM | POA: Diagnosis present

## 2020-01-30 DIAGNOSIS — S72121A Displaced fracture of lesser trochanter of right femur, initial encounter for closed fracture: Secondary | ICD-10-CM | POA: Diagnosis not present

## 2020-01-30 DIAGNOSIS — Z881 Allergy status to other antibiotic agents status: Secondary | ICD-10-CM

## 2020-01-30 DIAGNOSIS — S72341A Displaced spiral fracture of shaft of right femur, initial encounter for closed fracture: Secondary | ICD-10-CM | POA: Diagnosis not present

## 2020-01-30 DIAGNOSIS — S72141A Displaced intertrochanteric fracture of right femur, initial encounter for closed fracture: Secondary | ICD-10-CM | POA: Diagnosis not present

## 2020-01-30 DIAGNOSIS — M2550 Pain in unspecified joint: Secondary | ICD-10-CM | POA: Diagnosis not present

## 2020-01-30 DIAGNOSIS — S199XXA Unspecified injury of neck, initial encounter: Secondary | ICD-10-CM | POA: Diagnosis not present

## 2020-01-30 MED ORDER — FENTANYL CITRATE (PF) 100 MCG/2ML IJ SOLN
25.0000 ug | Freq: Once | INTRAMUSCULAR | Status: AC
  Administered 2020-01-30: 25 ug via INTRAVENOUS
  Filled 2020-01-30: qty 2

## 2020-01-30 MED ORDER — ONDANSETRON HCL 4 MG/2ML IJ SOLN: 4.0000 mg | Freq: Once | INTRAMUSCULAR | Status: AC

## 2020-01-30 MED ORDER — ONDANSETRON HCL 4 MG/2ML IJ SOLN
INTRAMUSCULAR | Status: AC
  Administered 2020-01-30: 4 mg via INTRAVENOUS
  Filled 2020-01-30: qty 2

## 2020-01-30 NOTE — ED Notes (Signed)
Son to bedside. Pt in Xray at this time.

## 2020-01-30 NOTE — ED Triage Notes (Signed)
To ED from [facility] assisted living after a fall. Hx of parkinson's and pt reports she is losing feeling in her legs. Shortening and rotation of right leg with laceration to right side of head. Bleeding is under control at this time. Pt denies LOC but has dementia at baseline. No other obvious injury noted.   EMS administered 100 mcg of Fentanyl in route.

## 2020-01-30 NOTE — ED Provider Notes (Signed)
[facility] Emergency Department Provider Note   ____________________________________________   First MD Initiated Contact with Patient 01/30/20 2308     (approximate)  I have reviewed the triage vital signs and the nursing notes.   HISTORY  Chief Complaint Fall   HPI Kara Levine is a 86 y.o. female brought to the ED via EMS from [place] status post mechanical fall.  Patient has Parkinson's dementia and states her feet are always numb.  Lost her balance tonight and fell backwards, striking her head and right hip.  Presents with scalp laceration and right hip deformity.  Denies vision changes, neck pain, chest pain, shortness of breath, abdominal pain, nausea, vomiting or dizziness.      Past Medical History:  Diagnosis Date  . Anxiety   . Hypertension   . Parkinson's disease [facility])     Patient Active Problem List   Diagnosis Date Noted  . Essential hypertension 06/14/2019  . Lymphedema 06/14/2019  . Swelling of limb 06/14/2019  . Congestive heart failure (CHF) ([place]) 06/05/2019  . Anxiety 10/31/2018  . Paroxysmal atrial fibrillation ([place]) 10/30/2018  . Heart palpitations 10/10/2018  . 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 Atypical chest pain 04/17/2018  . S/P placement of cardiac pacemaker 04/04/2018    Past Surgical History:  Procedure Laterality Date  . ABDOMINAL HYSTERECTOMY    . APPENDECTOMY    . ELBOW FRACTURE SURGERY Bilateral   . EYE SURGERY    . PACEMAKER IMPLANT      Prior to Admission medications   Medication Sig Start Date End Date Taking? Authorizing Provider  bisoprolol (ZEBETA) 5 MG tablet Take 5-10 mg by mouth 2 (two) times daily.     [provider]  busPIRone (BUSPAR) 5 MG tablet Take by mouth. 05/06/19 05/05/20  [provider]  carbidopa-levodopa (SINEMET IR) 25-100 MG tablet Take by mouth. 03/15/19 03/14/20  [provider]  Cranberry-Vitamin C (AZO CRANBERRY URINARY  TRACT) 250-60 MG CAPS Take by mouth 2 (two) times daily.    [provider]  furosemide (LASIX) 40 MG tablet Take 40 mg by mouth 2 (two) times daily.    [provider]  hydrALAZINE (APRESOLINE) 25 MG tablet Take 25 mg by mouth 3 (three) times daily. 03/04/19   [provider]  hydrocortisone 2.5 % cream  10/10/18   [provider]  mirabegron ER (MYRBETRIQ) 50 MG TB24 tablet Take 1 tablet (50 mg total) by mouth daily. 06/06/19   Billey Co, MD  nitrofurantoin (MACRODANTIN) 100 MG capsule  06/04/19   [provider]  omeprazole (PRILOSEC) 20 MG capsule Take by mouth. 10/04/18 10/04/19  [provider]  oxazepam (SERAX) 10 MG capsule Take 10 mg by mouth daily as needed for anxiety (raised blood pressure).    [provider]  potassium chloride (MICRO-K) 10 MEQ CR capsule Take 10 mEq by mouth daily.    [provider]  spironolactone (ALDACTONE) 25 MG tablet Take 25 mg by mouth daily. 06/08/19   [provider]    Allergies Codeine, Keflex [cephalexin], Norco [hydrocodone-acetaminophen], and Shellfish allergy  Family History  Problem Relation Age of Onset  . Stroke Mother   . Hypertension Mother   . Diabetes Father   . Diabetes Sister     Social History Social History   Tobacco Use  . Smoking status: Never Smoker  . Smokeless tobacco: Never Used  Substance Use Topics  . Alcohol use: Never  . Drug use:  Never    Review of Systems  Constitutional: No fever/chills Eyes: No visual changes. ENT: No sore throat. Cardiovascular: Denies chest pain. Respiratory: Denies shortness of breath. Gastrointestinal: No abdominal pain.  No nausea, no vomiting.  No diarrhea.  No constipation. Genitourinary: Negative for dysuria. Musculoskeletal: Positive for right hip pain.  Negative for back pain. Skin: Negative for rash. Neurological: Positive for scalp laceration.  Negative for headaches, focal weakness or  numbness.   ____________________________________________   PHYSICAL EXAM:  VITAL SIGNS: ED Triage Vitals  Enc Vitals Group     BP      Pulse      Resp      Temp      Temp src      SpO2      Weight      Height      Head Circumference      Peak Flow      Pain Score      Pain Loc      Pain Edu?      Excl. in [place]?     Constitutional: Alert and oriented.  Elderly appearing and in mild acute distress. Eyes: Conjunctivae are normal. PERRL. EOMI. Head: Approximately 1.5 cm curvilinear laceration behind right ear without active bleeding. Ears: Approximately 0.5 cm avulsion type laceration to top of right ear without active bleeding. Nose: Atraumatic. Mouth/Throat: Mucous membranes are mildly dry.  No dental malocclusion.   Neck: No stridor.  No cervical spine tenderness to palpation.  No step-offs or deformities noted. Cardiovascular: Normal rate, regular rhythm. Grossly normal heart sounds.  Good peripheral circulation. Respiratory: Normal respiratory effort.  No retractions. Lungs CTAB. Gastrointestinal: Soft and nontender to light or deep palpation. No distention. No abdominal bruits. No CVA tenderness. Musculoskeletal: No spinal tenderness to palpation.  Pelvis is stable.  Right hip shortened and externally rotated.  2+ distal pulses.  Brisk, less than 5-second capillary refill.  No joint effusions. Neurologic: Alert and oriented to person and place.  Normal speech and language. No gross focal neurologic deficits are appreciated.  Skin:  Skin is warm, dry and intact. No rash noted. Psychiatric: Mood and affect are normal. Speech and behavior are normal.  ____________________________________________   LABS (all labs ordered are listed, but only abnormal results are displayed)  Labs Reviewed  CBC WITH DIFFERENTIAL/PLATELET - Abnormal; Notable for the following components:      Result Value   WBC 16.0 (*)    Hemoglobin 11.9 (*)    HCT 35.0 (*)    Neutro Abs 13.3 (*)     Monocytes Absolute 1.4 (*)    Abs Immature Granulocytes 0.41 (*)    All other components within normal limits  SARS CORONAVIRUS 2 BY RT PCR (HOSPITAL ORDER, [place] [facility])  PROTIME-INR  COMPREHENSIVE METABOLIC PANEL  TROPONIN I (HIGH SENSITIVITY)   ____________________________________________  EKG  ED ECG REPORT I, Nalanie Winiecki J, the attending physician, personally viewed and interpreted this ECG.   Date: 01/30/2020  EKG Time: 2358  Rate: 103  Rhythm: sinus tachycardia  Axis: LAD  Intervals:right bundle branch block  ST&T Change: Nonspecific  ____________________________________________  RADIOLOGY  ED MD interpretation: Proximal right femur fracture; no acute cardiopulmonary process; no ICH, no cervical spine fracture/dislocation, cannot exclude ligamentous injury  Official radiology report(s): DG Chest 1 View  Result Date: 01/30/2020 CLINICAL DATA:  Pain status post fall EXAM: CHEST  1 VIEW COMPARISON:  July 16, 2019 FINDINGS: There is a dual chamber left-sided pacemaker  in place. The lungs are clear. There is no pneumothorax. No focal infiltrate. The heart size is borderline enlarged. Aortic calcifications are noted. The distal third of the right clavicle appears lucent which may be secondary to imaging technique. IMPRESSION: No acute cardiopulmonary process. Electronically Signed   By: Constance Holster M.D.   On: 01/30/2020 23:43   CT Head Wo Contrast  Result Date: 01/30/2020 CLINICAL DATA:  Head trauma resulting from a fall. EXAM: CT HEAD WITHOUT CONTRAST CT CERVICAL SPINE WITHOUT CONTRAST TECHNIQUE: Multidetector CT imaging of the head and cervical spine was performed following the standard protocol without intravenous contrast. Multiplanar CT image reconstructions of the cervical spine were also generated. COMPARISON:  None. FINDINGS: CT HEAD FINDINGS Brain: Diffuse cerebral atrophy. Ventricular dilatation consistent with central atrophy.  Low-attenuation changes in the deep white matter consistent with small vessel ischemia. No abnormal extra-axial fluid collections. No mass effect or midline shift. Gray-white matter junctions are distinct. Basal cisterns are not effaced. No acute intracranial hemorrhage. Vascular: Intracranial arterial vascular calcifications are present. Skull: The calvarium appears intact. Subcutaneous soft tissue scalp hematoma and laceration over the right posterior temporal region. Sinuses/Orbits: No acute finding. Other: None. CT CERVICAL SPINE FINDINGS Alignment: Slight anterior subluxation of C4 on C5. This is likely degenerative but ligamentous injury can have this appearance and can not be excluded entirely. Normal alignment of the posterior facet joints. C1-2 articulation appears intact. Skull base and vertebrae: Skull base appears intact. Degenerative changes demonstrated in the temporomandibular joints. No vertebral compression deformities. No focal bone lesions. Soft tissues and spinal canal: No prevertebral soft tissue swelling. No abnormal paraspinal soft tissue infiltration. Vascular calcifications. Disc levels: Degenerative changes diffusely throughout the cervical spine with narrowed interspaces and endplate hypertrophic changes. Degenerative changes throughout the cervical facet joints. Upper chest: Scarring and pleural thickening in the lung apices. Other: None. IMPRESSION: 1. No acute intracranial abnormalities. Chronic atrophy and small vessel ischemic changes. 2. Subcutaneous soft tissue scalp hematoma and laceration over the right posterior temporal region. 3. Slight anterior subluxation of C4 on C5 is likely degenerative but ligamentous injury can have this appearance and can not be excluded entirely. Diffuse degenerative changes throughout the cervical spine. No acute displaced fractures identified. Electronically Signed   By: Lucienne Capers M.D.   On: 01/30/2020 23:56   CT Cervical Spine Wo  Contrast  Result Date: 01/30/2020 CLINICAL DATA:  Head trauma resulting from a fall. EXAM: CT HEAD WITHOUT CONTRAST CT CERVICAL SPINE WITHOUT CONTRAST TECHNIQUE: Multidetector CT imaging of the head and cervical spine was performed following the standard protocol without intravenous contrast. Multiplanar CT image reconstructions of the cervical spine were also generated. COMPARISON:  None. FINDINGS: CT HEAD FINDINGS Brain: Diffuse cerebral atrophy. Ventricular dilatation consistent with central atrophy. Low-attenuation changes in the deep white matter consistent with small vessel ischemia. No abnormal extra-axial fluid collections. No mass effect or midline shift. Gray-white matter junctions are distinct. Basal cisterns are not effaced. No acute intracranial hemorrhage. Vascular: Intracranial arterial vascular calcifications are present. Skull: The calvarium appears intact. Subcutaneous soft tissue scalp hematoma and laceration over the right posterior temporal region. Sinuses/Orbits: No acute finding. Other: None. CT CERVICAL SPINE FINDINGS Alignment: Slight anterior subluxation of C4 on C5. This is likely degenerative but ligamentous injury can have this appearance and can not be excluded entirely. Normal alignment of the posterior facet joints. C1-2 articulation appears intact. Skull base and vertebrae: Skull base appears intact. Degenerative changes demonstrated in the temporomandibular joints. No vertebral compression  deformities. No focal bone lesions. Soft tissues and spinal canal: No prevertebral soft tissue swelling. No abnormal paraspinal soft tissue infiltration. Vascular calcifications. Disc levels: Degenerative changes diffusely throughout the cervical spine with narrowed interspaces and endplate hypertrophic changes. Degenerative changes throughout the cervical facet joints. Upper chest: Scarring and pleural thickening in the lung apices. Other: None. IMPRESSION: 1. No acute intracranial  abnormalities. Chronic atrophy and small vessel ischemic changes. 2. Subcutaneous soft tissue scalp hematoma and laceration over the right posterior temporal region. 3. Slight anterior subluxation of C4 on C5 is likely degenerative but ligamentous injury can have this appearance and can not be excluded entirely. Diffuse degenerative changes throughout the cervical spine. No acute displaced fractures identified. Electronically Signed   By: Lucienne Capers M.D.   On: 01/30/2020 23:56   DG Hip Unilat With Pelvis 2-3 Views Right  Result Date: 01/30/2020 CLINICAL DATA:  Pain status post fall EXAM: DG HIP (WITH OR WITHOUT PELVIS) 2-3V RIGHT COMPARISON:  None. FINDINGS: There is an acute displaced intratrochanteric/subtrochanteric fracture of the proximal right femur. There is a medially displaced fracture fragment measuring approximately 2.7 cm. There are end-stage degenerative changes of the right hip. The patient is status post total hip arthroplasty on the left. There is diffuse osteopenia. IMPRESSION: 1. Acute displaced fracture of the proximal right femur as detailed above. 2. End-stage degenerative changes of the right hip. 3. Osteopenia. 4. Status post total hip arthroplasty on the left. Electronically Signed   By: Constance Holster M.D.   On: 01/30/2020 23:42    ____________________________________________   PROCEDURES  Procedure(s) performed (including Critical Care):  Marland KitchenMarland KitchenLaceration Repair  Date/Time: 01/31/2020 12:45 AM Performed by: Paulette Blanch, MD Authorized by: Paulette Blanch, MD   Consent:    Consent obtained:  Verbal   Consent given by:  Patient   Risks discussed:  Infection, pain, poor cosmetic result and poor wound healing Anesthesia (see MAR for exact dosages):    Anesthesia method:  Local infiltration   Local anesthetic:  Lidocaine 1% w/o epi Laceration details:    Location:  Scalp   Scalp location:  R parietal   Length (cm):  1.5   Depth (mm):  2 Repair type:    Repair  type:  Simple Pre-procedure details:    Preparation:  Patient was prepped and draped in usual sterile fashion Exploration:    Hemostasis achieved with:  Direct pressure   Wound exploration: entire depth of wound probed and visualized     Contaminated: no   Treatment:    Area cleansed with:  Saline   Amount of cleaning:  Standard   Irrigation solution:  Sterile saline   Irrigation method:  Syringe   Visualized foreign bodies/material removed: no   Skin repair:    Repair method:  Sutures   Suture size:  5-0   Suture material:  Nylon   Suture technique:  Running   Number of sutures: continuous. Approximation:    Approximation:  Close Post-procedure details:    Dressing:  Antibiotic ointment and sterile dressing   Patient tolerance of procedure:  Tolerated well, no immediate complications .Marland KitchenLaceration Repair  Date/Time: 01/31/2020 12:46 AM Performed by: Paulette Blanch, MD Authorized by: Paulette Blanch, MD   Consent:    Consent obtained:  Verbal   Consent given by:  Patient   Risks discussed:  Infection, pain, poor cosmetic result and poor wound healing Anesthesia (see MAR for exact dosages):    Anesthesia method:  Local infiltration  Local anesthetic:  Lidocaine 1% w/o epi Laceration details:    Location:  Ear   Ear location:  R ear   Length (cm):  0.8   Depth (mm):  2 Repair type:    Repair type:  Simple Pre-procedure details:    Preparation:  Patient was prepped and draped in usual sterile fashion Exploration:    Hemostasis achieved with:  Direct pressure   Wound exploration: entire depth of wound probed and visualized     Contaminated: no   Treatment:    Area cleansed with:  Saline   Amount of cleaning:  Standard   Irrigation solution:  Sterile saline   Visualized foreign bodies/material removed: no   Skin repair:    Repair method:  Sutures   Suture size:  5-0   Suture material:  Nylon   Suture technique:  Simple interrupted   Number of sutures:   3 Approximation:    Approximation:  Close Post-procedure details:    Dressing:  Sterile dressing and antibiotic ointment   Patient tolerance of procedure:  Tolerated well, no immediate complications     ____________________________________________   INITIAL IMPRESSION / ASSESSMENT AND PLAN / ED COURSE  As part of my medical decision making, I reviewed the following data within the [facility] notes reviewed and incorporated, Labs reviewed, EKG interpreted, Old chart reviewed, Radiograph reviewed  and Notes from prior ED visits     Kara Levine was evaluated in Emergency Department on 01/31/2020 for the symptoms described in the history of present illness. She was evaluated in the context of the global COVID-19 pandemic, which necessitated consideration that the patient might be at risk for infection with the SARS-CoV-2 virus that causes COVID-19. Institutional protocols and algorithms that pertain to the evaluation of patients at risk for COVID-19 are in a state of rapid change based on information released by regulatory bodies including the CDC and federal and state organizations. These policies and algorithms were followed during the patient's care in the ED.    86 year old female who presents status post mechanical fall with scalp laceration and right hip deformity.  Differential diagnosis includes but is not limited to [place], right hip fracture, dislocation, musculoskeletal injury, etc.  Will send for CT head and x-rays of right hip.  IV fentanyl administered for pain.   Clinical Course as of Jan 31 43  Fri Jan 31, 2020  3151 Patient tolerated suture repair well.  Discussed case with Dr. Mack Guise from orthopedics who will see patient in the morning.  Will discuss with hospitalist services for admission.   [JS]    Clinical Course User Index [JS] Paulette Blanch, MD     ____________________________________________   FINAL CLINICAL IMPRESSION(S) /  ED DIAGNOSES  Final diagnoses:  Fall, initial encounter  Injury of head, initial encounter  Laceration of scalp, initial encounter  Closed fracture of right femur, unspecified fracture morphology, unspecified portion of femur, initial encounter ([place])  Laceration of right ear lobe, initial encounter     ED Discharge Orders    None       Note:  This document was prepared using Dragon voice recognition software and may include unintentional dictation errors.   Paulette Blanch, MD 01/31/20 [phone]

## 2020-01-31 ENCOUNTER — Inpatient Hospital Stay: Payer: PPO | Admitting: Certified Registered Nurse Anesthetist

## 2020-01-31 ENCOUNTER — Inpatient Hospital Stay: Payer: PPO

## 2020-01-31 ENCOUNTER — Encounter
Admission: EM | Disposition: A | Discharge status: Hospice | Payer: Self-pay | Source: Skilled Nursing Facility | Attending: Hospitalist

## 2020-01-31 ENCOUNTER — Other Ambulatory Visit: Payer: Self-pay

## 2020-01-31 ENCOUNTER — Encounter: Payer: Self-pay | Admitting: Internal Medicine

## 2020-01-31 DIAGNOSIS — Z833 Family history of diabetes mellitus: Secondary | ICD-10-CM | POA: Diagnosis not present

## 2020-01-31 DIAGNOSIS — Z8249 Family history of ischemic heart disease and other diseases of the circulatory system: Secondary | ICD-10-CM | POA: Diagnosis not present

## 2020-01-31 DIAGNOSIS — S72001A Fracture of unspecified part of neck of right femur, initial encounter for closed fracture: Secondary | ICD-10-CM

## 2020-01-31 DIAGNOSIS — G20 Parkinson's disease: Secondary | ICD-10-CM | POA: Diagnosis present

## 2020-01-31 DIAGNOSIS — Z01818 Encounter for other preprocedural examination: Secondary | ICD-10-CM

## 2020-01-31 DIAGNOSIS — Z79899 Other long term (current) drug therapy: Secondary | ICD-10-CM | POA: Diagnosis not present

## 2020-01-31 DIAGNOSIS — F329 Major depressive disorder, single episode, unspecified: Secondary | ICD-10-CM | POA: Diagnosis present

## 2020-01-31 DIAGNOSIS — Z66 Do not resuscitate: Secondary | ICD-10-CM | POA: Diagnosis not present

## 2020-01-31 DIAGNOSIS — R636 Underweight: Secondary | ICD-10-CM | POA: Diagnosis present

## 2020-01-31 DIAGNOSIS — S0101XA Laceration without foreign body of scalp, initial encounter: Secondary | ICD-10-CM | POA: Diagnosis present

## 2020-01-31 DIAGNOSIS — F419 Anxiety disorder, unspecified: Secondary | ICD-10-CM | POA: Diagnosis present

## 2020-01-31 DIAGNOSIS — Z20822 Contact with and (suspected) exposure to covid-19: Secondary | ICD-10-CM | POA: Diagnosis present

## 2020-01-31 DIAGNOSIS — K219 Gastro-esophageal reflux disease without esophagitis: Secondary | ICD-10-CM | POA: Diagnosis present

## 2020-01-31 DIAGNOSIS — D72829 Elevated white blood cell count, unspecified: Secondary | ICD-10-CM | POA: Diagnosis present

## 2020-01-31 DIAGNOSIS — S7221XA Displaced subtrochanteric fracture of right femur, initial encounter for closed fracture: Secondary | ICD-10-CM | POA: Diagnosis present

## 2020-01-31 DIAGNOSIS — Z823 Family history of stroke: Secondary | ICD-10-CM | POA: Diagnosis not present

## 2020-01-31 DIAGNOSIS — W1830XA Fall on same level, unspecified, initial encounter: Secondary | ICD-10-CM | POA: Diagnosis present

## 2020-01-31 DIAGNOSIS — W19XXXA Unspecified fall, initial encounter: Secondary | ICD-10-CM

## 2020-01-31 DIAGNOSIS — Z681 Body mass index (BMI) 19 or less, adult: Secondary | ICD-10-CM | POA: Diagnosis not present

## 2020-01-31 DIAGNOSIS — I495 Sick sinus syndrome: Secondary | ICD-10-CM | POA: Diagnosis present

## 2020-01-31 DIAGNOSIS — S01311A Laceration without foreign body of right ear, initial encounter: Secondary | ICD-10-CM | POA: Diagnosis present

## 2020-01-31 DIAGNOSIS — Z885 Allergy status to narcotic agent status: Secondary | ICD-10-CM | POA: Diagnosis not present

## 2020-01-31 DIAGNOSIS — Z950 Presence of cardiac pacemaker: Secondary | ICD-10-CM | POA: Diagnosis not present

## 2020-01-31 DIAGNOSIS — Z515 Encounter for palliative care: Secondary | ICD-10-CM | POA: Diagnosis not present

## 2020-01-31 DIAGNOSIS — Z90710 Acquired absence of both cervix and uterus: Secondary | ICD-10-CM | POA: Diagnosis not present

## 2020-01-31 DIAGNOSIS — Z888 Allergy status to other drugs, medicaments and biological substances status: Secondary | ICD-10-CM | POA: Diagnosis not present

## 2020-01-31 DIAGNOSIS — Z91013 Allergy to seafood: Secondary | ICD-10-CM | POA: Diagnosis not present

## 2020-01-31 DIAGNOSIS — E871 Hypo-osmolality and hyponatremia: Secondary | ICD-10-CM | POA: Diagnosis present

## 2020-01-31 HISTORY — PX: INTRAMEDULLARY (IM) NAIL INTERTROCHANTERIC: SHX5875

## 2020-01-31 LAB — CBC WITH DIFFERENTIAL/PLATELET
Abs Immature Granulocytes: 0.41 10*3/uL — ABNORMAL HIGH (ref 0.00–0.07)
Basophils Absolute: 0 10*3/uL (ref 0.0–0.1)
Basophils Relative: 0 %
Eosinophils Absolute: 0 10*3/uL (ref 0.0–0.5)
Eosinophils Relative: 0 %
HCT: 35 % — ABNORMAL LOW (ref 36.0–46.0)
Hemoglobin: 11.9 g/dL — ABNORMAL LOW (ref 12.0–15.0)
Immature Granulocytes: 3 %
Lymphocytes Relative: 6 %
Lymphs Abs: 0.9 10*3/uL (ref 0.7–4.0)
MCH: 30.4 pg (ref 26.0–34.0)
MCHC: 34 g/dL (ref 30.0–36.0)
MCV: 89.5 fL (ref 80.0–100.0)
Monocytes Absolute: 1.4 10*3/uL — ABNORMAL HIGH (ref 0.1–1.0)
Monocytes Relative: 9 %
Neutro Abs: 13.3 10*3/uL — ABNORMAL HIGH (ref 1.7–7.7)
Neutrophils Relative %: 82 %
Platelets: 233 10*3/uL (ref 150–400)
RBC: 3.91 MIL/uL (ref 3.87–5.11)
RDW: 12.6 % (ref 11.5–15.5)
WBC: 16 10*3/uL — ABNORMAL HIGH (ref 4.0–10.5)
nRBC: 0 % (ref 0.0–0.2)

## 2020-01-31 LAB — TROPONIN I (HIGH SENSITIVITY)
Troponin I (High Sensitivity): 139 ng/L (ref <18)
Troponin I (High Sensitivity): 70 ng/L — ABNORMAL HIGH (ref <18)
Troponin I (High Sensitivity): 79 ng/L — ABNORMAL HIGH (ref <18)

## 2020-01-31 LAB — CBC
HCT: 28.9 % — ABNORMAL LOW (ref 36.0–46.0)
Hemoglobin: 9.8 g/dL — ABNORMAL LOW (ref 12.0–15.0)
MCH: 30.7 pg (ref 26.0–34.0)
MCHC: 33.9 g/dL (ref 30.0–36.0)
MCV: 90.6 fL (ref 80.0–100.0)
Platelets: 195 10*3/uL (ref 150–400)
RBC: 3.19 MIL/uL — ABNORMAL LOW (ref 3.87–5.11)
RDW: 12.5 % (ref 11.5–15.5)
WBC: 13.4 10*3/uL — ABNORMAL HIGH (ref 4.0–10.5)
nRBC: 0 % (ref 0.0–0.2)

## 2020-01-31 LAB — COMPREHENSIVE METABOLIC PANEL
ALT: 11 U/L (ref 0–44)
AST: 29 U/L (ref 15–41)
Albumin: 4.3 g/dL (ref 3.5–5.0)
Alkaline Phosphatase: 101 U/L (ref 38–126)
Anion gap: 12 (ref 5–15)
BUN: 25 mg/dL — ABNORMAL HIGH (ref 8–23)
CO2: 25 mmol/L (ref 22–32)
Calcium: 9 mg/dL (ref 8.9–10.3)
Chloride: 94 mmol/L — ABNORMAL LOW (ref 98–111)
Creatinine, Ser: 0.91 mg/dL (ref 0.44–1.00)
GFR calc Af Amer: >60 mL/min (ref >60)
GFR calc non Af Amer: 57 mL/min — ABNORMAL LOW (ref >60)
Glucose, Bld: 132 mg/dL — ABNORMAL HIGH (ref 70–99)
Potassium: 4 mmol/L (ref 3.5–5.1)
Sodium: 131 mmol/L — ABNORMAL LOW (ref 135–145)
Total Bilirubin: 0.8 mg/dL (ref 0.3–1.2)
Total Protein: 7.1 g/dL (ref 6.5–8.1)

## 2020-01-31 LAB — SARS CORONAVIRUS 2 BY RT PCR (HOSPITAL ORDER, PERFORMED IN ~~LOC~~ HOSPITAL LAB): SARS Coronavirus 2: NEGATIVE

## 2020-01-31 LAB — APTT: aPTT: 33 seconds (ref 24–36)

## 2020-01-31 LAB — PROTIME-INR
INR: 1 (ref 0.8–1.2)
Prothrombin Time: 12.3 seconds (ref 11.4–15.2)

## 2020-01-31 SURGERY — FIXATION, FRACTURE, INTERTROCHANTERIC, WITH INTRAMEDULLARY ROD
Anesthesia: Spinal | Laterality: Right

## 2020-01-31 MED ORDER — METHOCARBAMOL 500 MG PO TABS
500.0000 mg | ORAL_TABLET | Freq: Four times a day (QID) | ORAL | Status: DC | PRN
  Administered 2020-02-01 (×2): 500 mg via ORAL
  Filled 2020-01-31 (×2): qty 1

## 2020-01-31 MED ORDER — BUSPIRONE HCL 10 MG PO TABS
15.0000 mg | ORAL_TABLET | Freq: Every day | ORAL | Status: DC
  Administered 2020-02-01: 15 mg via ORAL
  Filled 2020-01-31 (×2): qty 2

## 2020-01-31 MED ORDER — TRAMADOL HCL 50 MG PO TABS
50.0000 mg | ORAL_TABLET | Freq: Four times a day (QID) | ORAL | Status: DC
  Administered 2020-02-01 – 2020-02-02 (×3): 50 mg via ORAL
  Filled 2020-01-31 (×6): qty 1

## 2020-01-31 MED ORDER — PROPOFOL 10 MG/ML IV BOLUS
INTRAVENOUS | Status: DC | PRN
  Administered 2020-01-31: 20 mg via INTRAVENOUS
  Administered 2020-01-31: 10 mg via INTRAVENOUS
  Administered 2020-01-31: 20 mg via INTRAVENOUS

## 2020-01-31 MED ORDER — SODIUM CHLORIDE 0.9 % IV SOLN: INTRAVENOUS | Status: DC

## 2020-01-31 MED ORDER — ENSURE ENLIVE PO LIQD
237.0000 mL | Freq: Two times a day (BID) | ORAL | Status: DC
  Administered 2020-02-01 (×2): 237 mL via ORAL
  Filled 2020-01-31: qty 237

## 2020-01-31 MED ORDER — BISACODYL 10 MG RE SUPP: 10.0000 mg | Freq: Every day | RECTAL | Status: DC | PRN

## 2020-01-31 MED ORDER — FENTANYL CITRATE (PF) 100 MCG/2ML IJ SOLN
INTRAMUSCULAR | Status: AC
  Administered 2020-01-31: 25 ug via INTRAVENOUS
  Filled 2020-01-31: qty 2

## 2020-01-31 MED ORDER — KETOROLAC TROMETHAMINE 15 MG/ML IJ SOLN
7.5000 mg | Freq: Four times a day (QID) | INTRAMUSCULAR | Status: AC
  Administered 2020-02-01: 7.5 mg via INTRAVENOUS
  Filled 2020-01-31 (×3): qty 1

## 2020-01-31 MED ORDER — DOCUSATE SODIUM 100 MG PO CAPS
100.0000 mg | ORAL_CAPSULE | Freq: Two times a day (BID) | ORAL | Status: DC
  Administered 2020-01-31 – 2020-02-01 (×3): 100 mg via ORAL
  Filled 2020-01-31 (×4): qty 1

## 2020-01-31 MED ORDER — SODIUM CHLORIDE 0.9 % IV SOLN
INTRAVENOUS | Status: DC | PRN
  Administered 2020-01-31: 40 ug/min via INTRAVENOUS

## 2020-01-31 MED ORDER — ACETAMINOPHEN 10 MG/ML IV SOLN
INTRAVENOUS | Status: AC
  Filled 2020-01-31: qty 100

## 2020-01-31 MED ORDER — LORATADINE 10 MG PO TABS: 10.0000 mg | ORAL_TABLET | Freq: Every day | ORAL | Status: DC | PRN

## 2020-01-31 MED ORDER — MAGNESIUM CITRATE PO SOLN
1.0000 | Freq: Once | ORAL | Status: DC | PRN
  Filled 2020-01-31: qty 296

## 2020-01-31 MED ORDER — LIDOCAINE HCL (PF) 2 % IJ SOLN
INTRAMUSCULAR | Status: AC
  Filled 2020-01-31: qty 5

## 2020-01-31 MED ORDER — CHLORHEXIDINE GLUCONATE CLOTH 2 % EX PADS
6.0000 | MEDICATED_PAD | Freq: Every day | CUTANEOUS | Status: DC
  Administered 2020-02-01: 6 via TOPICAL

## 2020-01-31 MED ORDER — CRANBERRY-VITAMIN C 250-60 MG PO CAPS: ORAL_CAPSULE | Freq: Two times a day (BID) | ORAL | Status: DC

## 2020-01-31 MED ORDER — ALUM & MAG HYDROXIDE-SIMETH 200-200-20 MG/5ML PO SUSP: 30.0000 mL | ORAL | Status: DC | PRN

## 2020-01-31 MED ORDER — METHOCARBAMOL 1000 MG/10ML IJ SOLN
500.0000 mg | Freq: Four times a day (QID) | INTRAVENOUS | Status: DC | PRN
  Filled 2020-01-31: qty 5

## 2020-01-31 MED ORDER — VANCOMYCIN HCL IN DEXTROSE 1-5 GM/200ML-% IV SOLN
1000.0000 mg | Freq: Two times a day (BID) | INTRAVENOUS | Status: AC
  Administered 2020-02-01: 1000 mg via INTRAVENOUS
  Filled 2020-01-31 (×2): qty 200

## 2020-01-31 MED ORDER — FENTANYL CITRATE (PF) 100 MCG/2ML IJ SOLN
25.0000 ug | Freq: Once | INTRAMUSCULAR | Status: AC
  Administered 2020-01-31: 25 ug via INTRAVENOUS
  Filled 2020-01-31: qty 2

## 2020-01-31 MED ORDER — ONDANSETRON HCL 4 MG/2ML IJ SOLN
INTRAMUSCULAR | Status: AC
  Filled 2020-01-31: qty 2

## 2020-01-31 MED ORDER — METHOCARBAMOL 1000 MG/10ML IJ SOLN
500.0000 mg | Freq: Four times a day (QID) | INTRAVENOUS | Status: DC | PRN
  Administered 2020-01-31: 500 mg via INTRAVENOUS
  Filled 2020-01-31 (×2): qty 5

## 2020-01-31 MED ORDER — ENOXAPARIN SODIUM 40 MG/0.4ML ~~LOC~~ SOLN
40.0000 mg | SUBCUTANEOUS | Status: DC
  Administered 2020-02-01: 40 mg via SUBCUTANEOUS
  Filled 2020-01-31: qty 0.4

## 2020-01-31 MED ORDER — ONDANSETRON HCL 4 MG/2ML IJ SOLN
INTRAMUSCULAR | Status: DC | PRN
  Administered 2020-01-31: 4 mg via INTRAVENOUS

## 2020-01-31 MED ORDER — ACETAMINOPHEN 325 MG PO TABS
325.0000 mg | ORAL_TABLET | Freq: Four times a day (QID) | ORAL | Status: DC | PRN
  Filled 2020-01-31: qty 2

## 2020-01-31 MED ORDER — HYDROCODONE-ACETAMINOPHEN 5-325 MG PO TABS: 1.0000 | ORAL_TABLET | Freq: Four times a day (QID) | ORAL | Status: DC | PRN

## 2020-01-31 MED ORDER — PANTOPRAZOLE SODIUM 40 MG PO TBEC
40.0000 mg | DELAYED_RELEASE_TABLET | Freq: Every day | ORAL | Status: DC
  Administered 2020-02-01: 40 mg via ORAL
  Filled 2020-01-31 (×2): qty 1

## 2020-01-31 MED ORDER — BUPIVACAINE HCL (PF) 0.5 % IJ SOLN
INTRAMUSCULAR | Status: DC | PRN
  Administered 2020-01-31: 3 mL via INTRATHECAL

## 2020-01-31 MED ORDER — POLYETHYLENE GLYCOL 3350 17 G PO PACK: 17.0000 g | PACK | Freq: Every day | ORAL | Status: DC | PRN

## 2020-01-31 MED ORDER — ACETAMINOPHEN 500 MG PO TABS
1000.0000 mg | ORAL_TABLET | Freq: Four times a day (QID) | ORAL | Status: AC
  Administered 2020-01-31 – 2020-02-01 (×3): 1000 mg via ORAL
  Filled 2020-01-31 (×3): qty 2

## 2020-01-31 MED ORDER — BUPIVACAINE HCL (PF) 0.5 % IJ SOLN
INTRAMUSCULAR | Status: AC
  Filled 2020-01-31: qty 10

## 2020-01-31 MED ORDER — FENTANYL CITRATE (PF) 100 MCG/2ML IJ SOLN
INTRAMUSCULAR | Status: AC
  Filled 2020-01-31: qty 2

## 2020-01-31 MED ORDER — DEXAMETHASONE SODIUM PHOSPHATE 10 MG/ML IJ SOLN
INTRAMUSCULAR | Status: DC | PRN
  Administered 2020-01-31: 5 mg via INTRAVENOUS

## 2020-01-31 MED ORDER — TRIAMCINOLONE ACETONIDE 0.1 % EX CREA
1.0000 "application " | TOPICAL_CREAM | Freq: Two times a day (BID) | CUTANEOUS | Status: DC | PRN
  Filled 2020-01-31: qty 15

## 2020-01-31 MED ORDER — LACTATED RINGERS IV SOLN: Freq: Once | INTRAVENOUS | Status: AC

## 2020-01-31 MED ORDER — FENTANYL CITRATE (PF) 100 MCG/2ML IJ SOLN: 25.0000 ug | INTRAMUSCULAR | Status: DC | PRN

## 2020-01-31 MED ORDER — MORPHINE SULFATE (PF) 2 MG/ML IV SOLN
0.5000 mg | INTRAVENOUS | Status: DC | PRN
  Administered 2020-01-31 – 2020-02-01 (×4): 0.5 mg via INTRAVENOUS
  Filled 2020-01-31 (×4): qty 1

## 2020-01-31 MED ORDER — ASPIRIN 81 MG PO CHEW
81.0000 mg | CHEWABLE_TABLET | Freq: Every day | ORAL | Status: DC
  Administered 2020-02-01: 81 mg via ORAL
  Filled 2020-01-31 (×2): qty 1

## 2020-01-31 MED ORDER — LIDOCAINE HCL (CARDIAC) PF 100 MG/5ML IV SOSY
PREFILLED_SYRINGE | INTRAVENOUS | Status: DC | PRN
  Administered 2020-01-31: 40 mg via INTRAVENOUS

## 2020-01-31 MED ORDER — OXYCODONE HCL 5 MG PO TABS: 5.0000 mg | ORAL_TABLET | ORAL | Status: DC | PRN

## 2020-01-31 MED ORDER — HYDRALAZINE HCL 25 MG PO TABS: 25.0000 mg | ORAL_TABLET | Freq: Every day | ORAL | Status: DC | PRN

## 2020-01-31 MED ORDER — MIRTAZAPINE 15 MG PO TABS
7.5000 mg | ORAL_TABLET | Freq: Every day | ORAL | Status: DC
  Administered 2020-01-31 – 2020-02-01 (×2): 7.5 mg via ORAL
  Filled 2020-01-31 (×2): qty 1

## 2020-01-31 MED ORDER — ADULT MULTIVITAMIN W/MINERALS CH
1.0000 | ORAL_TABLET | Freq: Every day | ORAL | Status: DC
  Administered 2020-02-01: 1 via ORAL
  Filled 2020-01-31 (×3): qty 1

## 2020-01-31 MED ORDER — ARTIFICIAL TEARS OPHTHALMIC OINT
TOPICAL_OINTMENT | OPHTHALMIC | Status: AC
  Filled 2020-01-31: qty 3.5

## 2020-01-31 MED ORDER — LIDOCAINE HCL (PF) 1 % IJ SOLN
INTRAMUSCULAR | Status: AC
  Filled 2020-01-31: qty 10

## 2020-01-31 MED ORDER — ONDANSETRON HCL 4 MG/2ML IJ SOLN: 4.0000 mg | Freq: Once | INTRAMUSCULAR | Status: DC | PRN

## 2020-01-31 MED ORDER — PROPOFOL 500 MG/50ML IV EMUL
INTRAVENOUS | Status: DC | PRN
  Administered 2020-01-31: 30 ug/kg/min via INTRAVENOUS

## 2020-01-31 MED ORDER — ONDANSETRON HCL 4 MG PO TABS: 4.0000 mg | ORAL_TABLET | Freq: Four times a day (QID) | ORAL | Status: DC | PRN

## 2020-01-31 MED ORDER — HYDRALAZINE HCL 20 MG/ML IJ SOLN: 10.0000 mg | Freq: Four times a day (QID) | INTRAMUSCULAR | Status: DC | PRN

## 2020-01-31 MED ORDER — SENNA 8.6 MG PO TABS
1.0000 | ORAL_TABLET | Freq: Two times a day (BID) | ORAL | Status: DC
  Administered 2020-01-31 – 2020-02-01 (×3): 8.6 mg via ORAL
  Filled 2020-01-31 (×4): qty 1

## 2020-01-31 MED ORDER — DEXAMETHASONE SODIUM PHOSPHATE 10 MG/ML IJ SOLN
INTRAMUSCULAR | Status: AC
  Filled 2020-01-31: qty 1

## 2020-01-31 MED ORDER — ONDANSETRON HCL 4 MG/2ML IJ SOLN: 4.0000 mg | Freq: Four times a day (QID) | INTRAMUSCULAR | Status: DC | PRN

## 2020-01-31 MED ORDER — VANCOMYCIN HCL IN DEXTROSE 1-5 GM/200ML-% IV SOLN
1000.0000 mg | INTRAVENOUS | Status: AC
  Administered 2020-01-31: 1000 mg via INTRAVENOUS
  Filled 2020-01-31: qty 200

## 2020-01-31 MED ORDER — ACETAMINOPHEN 10 MG/ML IV SOLN
INTRAVENOUS | Status: DC | PRN
Start: 2020-01-31 — End: 2020-01-31
  Administered 2020-01-31: 1000 mg via INTRAVENOUS

## 2020-01-31 MED ORDER — GENTAMICIN SULFATE 40 MG/ML IJ SOLN
INTRAMUSCULAR | Status: AC
  Filled 2020-01-31: qty 2

## 2020-01-31 MED ORDER — CARBIDOPA-LEVODOPA 25-100 MG PO TABS
0.5000 | ORAL_TABLET | Freq: Three times a day (TID) | ORAL | Status: DC
  Administered 2020-01-31 – 2020-02-01 (×4): 0.5 via ORAL
  Filled 2020-01-31 (×11): qty 0.5

## 2020-01-31 MED ORDER — LIDOCAINE HCL 1 % IJ SOLN
INTRAMUSCULAR | Status: DC | PRN
  Administered 2020-01-31: 3 mL
  Administered 2020-01-31: 2 mL

## 2020-01-31 MED ORDER — ASCORBIC ACID 500 MG PO TABS
250.0000 mg | ORAL_TABLET | Freq: Two times a day (BID) | ORAL | Status: DC
  Administered 2020-01-31 – 2020-02-01 (×3): 250 mg via ORAL
  Filled 2020-01-31 (×4): qty 1

## 2020-01-31 MED ORDER — PROPOFOL 500 MG/50ML IV EMUL
INTRAVENOUS | Status: AC
  Filled 2020-01-31: qty 50

## 2020-01-31 MED ORDER — SODIUM CHLORIDE 0.9 % IR SOLN: Status: DC | PRN

## 2020-01-31 SURGICAL SUPPLY — 40 items
BIT DRILL 4.3MMS DISTAL GRDTED (BIT) ×1 IMPLANT
BNDG COHESIVE 6X5 TAN STRL LF (GAUZE/BANDAGES/DRESSINGS) ×6 IMPLANT
CANISTER SUCT 1200ML W/VALVE (MISCELLANEOUS) ×3 IMPLANT
COVER WAND RF STERILE (DRAPES) ×3 IMPLANT
DRAPE 3/4 80X56 (DRAPES) ×3 IMPLANT
DRAPE SURG 17X11 SM STRL (DRAPES) ×6 IMPLANT
DRAPE U-SHAPE 47X51 STRL (DRAPES) ×3 IMPLANT
DRILL 4.3MMS DISTAL GRADUATED (BIT) ×3
DRSG OPSITE POSTOP 3X4 (GAUZE/BANDAGES/DRESSINGS) ×6 IMPLANT
DRSG OPSITE POSTOP 4X14 (GAUZE/BANDAGES/DRESSINGS) IMPLANT
DRSG OPSITE POSTOP 4X6 (GAUZE/BANDAGES/DRESSINGS) ×3 IMPLANT
DURAPREP 26ML APPLICATOR (WOUND CARE) ×6 IMPLANT
ELECT REM PT RETURN 9FT ADLT (ELECTROSURGICAL) ×3
ELECTRODE REM PT RTRN 9FT ADLT (ELECTROSURGICAL) ×1 IMPLANT
GLOVE BIOGEL PI IND STRL 9 (GLOVE) ×1 IMPLANT
GLOVE BIOGEL PI INDICATOR 9 (GLOVE) ×2
GLOVE SURG 9.0 ORTHO LTXF (GLOVE) ×6 IMPLANT
GOWN STRL REUS TWL 2XL XL LVL4 (GOWN DISPOSABLE) ×3 IMPLANT
GOWN STRL REUS W/ TWL LRG LVL3 (GOWN DISPOSABLE) ×1 IMPLANT
GOWN STRL REUS W/TWL LRG LVL3 (GOWN DISPOSABLE) ×2
GUIDEWIRE BALL NOSE 100CM (WIRE) ×3 IMPLANT
HEMOVAC 400CC 10FR (MISCELLANEOUS) IMPLANT
HFN RH 130 DEG 9MM X 340MM (Nail) ×3 IMPLANT
KIT TURNOVER CYSTO (KITS) ×3 IMPLANT
MAT ABSORB  FLUID 56X50 GRAY (MISCELLANEOUS) ×2
MAT ABSORB FLUID 56X50 GRAY (MISCELLANEOUS) ×1 IMPLANT
NS IRRIG 1000ML POUR BTL (IV SOLUTION) ×3 IMPLANT
PACK HIP COMPR (MISCELLANEOUS) ×3 IMPLANT
PAD ARMBOARD 7.5X6 YLW CONV (MISCELLANEOUS) ×3 IMPLANT
SCREW BONE CORTICAL 5.0X40 (Screw) ×3 IMPLANT
SCREW BONE CORTICAL 5.0X44 (Screw) ×3 IMPLANT
SCREW LAG HIP NAIL 10.5X95 (Screw) ×3 IMPLANT
STAPLER SKIN PROX 35W (STAPLE) ×3 IMPLANT
SUCTION FRAZIER HANDLE 10FR (MISCELLANEOUS)
SUCTION TUBE FRAZIER 10FR DISP (MISCELLANEOUS) IMPLANT
SUT VIC AB 0 CT1 36 (SUTURE) ×6 IMPLANT
SUT VIC AB 2-0 CT1 27 (SUTURE) ×2
SUT VIC AB 2-0 CT1 TAPERPNT 27 (SUTURE) ×1 IMPLANT
SUT VICRYL 0 AB UR-6 (SUTURE) ×3 IMPLANT
SYR 30ML LL (SYRINGE) ×3 IMPLANT

## 2020-01-31 NOTE — Anesthesia Procedure Notes (Addendum)
Spinal  Patient location during procedure: OR Start time: 01/31/2020 3:15 PM End time: 01/31/2020 3:34 PM Staffing Performed: anesthesiologist and resident/CRNA  Anesthesiologist: Tera Mater, MD Resident/CRNA: Eben Burow, CRNA Preanesthetic Checklist Completed: patient identified, IV checked, site marked, risks and benefits discussed, surgical consent, monitors and equipment checked, pre-op evaluation and timeout performed Spinal Block Patient position: sitting Prep: Betadine and site prepped and draped Patient monitoring: heart rate, continuous pulse ox and blood pressure Approach: midline Location: L3-4 Injection technique: single-shot Needle Needle type: Pencan  Needle gauge: 24 G Needle length: 9 cm Additional Notes Several attempts in lateral position by CRNA.  Position changed to sitting up with sedation, with subsequent successful placement.

## 2020-01-31 NOTE — TOC Initial Note (Signed)
Transition of Care [facility]) - Initial/Assessment Note    Patient Details  Name: Kara Levine MRN: [id number] Date of Birth: 1931/10/08  Transition of Care [facility]) CM/SW Contact:    Anselm Pancoast, RN Phone Number: 01/31/2020, 9:45 AM  Clinical Narrative:                 LVMM for son, Phylliss Blakes @ [phone] requesting callback.   Spoke to son, Lanny Hurst, who confirmed patient lives in [place] and would like to return there after surgery however he understands she will most likely need SNF for a short period of time before returning to [place]. Will follow after surgery.         Patient Goals and CMS Choice        Expected Discharge Plan and Services                                                Prior Living Arrangements/Services                       Activities of Daily Living      Permission Sought/Granted                  Emotional Assessment              Admission diagnosis:  Closed right hip fracture ([place]) [S72.001A] Patient Active Problem List   Diagnosis Date Noted  . Closed right hip fracture ([place]) 01/31/2020  . Laceration of scalp 01/31/2020  . Accidental fall 01/31/2020  . Preoperative clearance 01/31/2020  . Essential hypertension 06/14/2019  . Lymphedema 06/14/2019  . Swelling of limb 06/14/2019  . Congestive heart failure (CHF) ([place]) 06/05/2019  . Anxiety 10/31/2018  . Paroxysmal atrial fibrillation ([place]) 10/30/2018  . Heart palpitations 10/10/2018  . 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 Atypical chest pain 04/17/2018  . S/P placement of cardiac pacemaker 04/04/2018   PCP:  Kirk Ruths, MD Pharmacy:   [place], [place] - [address] [place] [address] Phone: [phone] Fax: [phone]     Social Determinants of Health (SDOH) Interventions    Readmission Risk Interventions No flowsheet data found.

## 2020-01-31 NOTE — ED Notes (Signed)
Report given to OR RN.

## 2020-01-31 NOTE — Anesthesia Preprocedure Evaluation (Signed)
Anesthesia Evaluation  Patient identified by MRN, date of birth, ID band Patient awake    Reviewed: Allergy & Precautions, H&P , NPO status , Patient's Chart, lab work & pertinent test results, reviewed documented beta blocker date and time   Airway Mallampati: II   Neck ROM: full    Dental  (+) Poor Dentition   Pulmonary neg pulmonary ROS,    Pulmonary exam normal        Cardiovascular Exercise Tolerance: Poor hypertension, On Medications +CHF  Normal cardiovascular exam Rhythm:regular Rate:Normal     Neuro/Psych Anxiety negative neurological ROS  negative psych ROS   GI/Hepatic negative GI ROS, Neg liver ROS,   Endo/Other  negative endocrine ROS  Renal/GU negative Renal ROS  negative genitourinary   Musculoskeletal   Abdominal   Peds  Hematology negative hematology ROS (+)   Anesthesia Other Findings Past Medical History: No date: Anxiety No date: Hypertension No date: Parkinson's disease [facility]) Past Surgical History: No date: ABDOMINAL HYSTERECTOMY No date: APPENDECTOMY No date: ELBOW FRACTURE SURGERY; Bilateral No date: EYE SURGERY No date: PACEMAKER IMPLANT   Reproductive/Obstetrics negative OB ROS                             Anesthesia Physical Anesthesia Plan  ASA: III and emergent  Anesthesia Plan: Spinal   Post-op Pain Management:    Induction:   PONV Risk Score and Plan:   Airway Management Planned:   Additional Equipment:   Intra-op Plan:   Post-operative Plan:   Informed Consent: I have reviewed the patients History and Physical, chart, labs and discussed the procedure including the risks, benefits and alternatives for the proposed anesthesia with the patient or authorized representative who has indicated his/her understanding and acceptance.     Dental Advisory Given  Plan Discussed with: CRNA  Anesthesia Plan Comments:         Anesthesia  Quick Evaluation

## 2020-01-31 NOTE — Op Note (Signed)
01/31/2020  5:55 PM  PATIENT:  Kara Levine    PRE-OPERATIVE DIAGNOSIS:  Right Subtrochanteric Femur Fracture  POST-OPERATIVE DIAGNOSIS:  Same  PROCEDURE:  INTRAMEDULLARY FIXATION OF RIGHT SUBTROCHANTERIC FEMUR FRACTURE  SURGEON:  Thornton Park, MD  ANESTHESIA:   Spinal  EBL:  50 cc  IMPLANT:  ZIMMER BIOMET AFFIXUS NAIL 62mm x 37mm with a 95 mm lag screw and distal interlocking screws 110mm  and 44 mm in length.  PREOPERATIVE INDICATIONS:  Kara Levine is a  86 y.o. female with Parkinson's and peripheral neuropathy who was admitted overnight with a diagnosis of a Right Subtrochanteric Fracture status post fall.    The risks, benefits and alternatives were discussed with the patient and her son.  The risks include but are not limited to infection, bleeding requiring blood transfusion, nerve or blood vessel injury, malunion, nonunion, hardware prominence, hardware failure, leg length discrepancy or change in lower extremity rotation and need for further surgery including hardware removal with conversion to a total hip arthroplasty. Medical risks include but are not limited to DVT and pulmonary embolism, myocardial infarction, stroke, pneumonia, respiratory failure and death. The patient and her son understood these risks and wished to proceed with surgery.  OPERATIVE PROCEDURE:  The patient was brought to the operating room and placed in the supine position on the fracture table. The patient received spinal anesthesia.  A closed reduction was performed under C-arm guidance.  The fracture reduction was confirmed on both AP and lateral views. After adequate reduction was achieved, a time out was performed to verify the patient's name, date of birth, medical record number, correct site of surgery correct procedure to be performed. The timeout was also used to verify the patient received antibiotics and all appropriate instruments, implants and radiographic studies were available  in the room. Once all in attendance were in agreement, the case began. The patient was prepped and draped in a sterile fashion.  She received preoperative antibiotics with 1 g of vancomycin IV due to her allergy to cephalosporins.  An incision was made proximal to the greater trochanter in line with the femur. A guidewire was placed over the tip of the greater trochanter and advanced by drill into the proximal femur to the level of the lesser trochanter.  Confirmation of the drill pin position was made on AP and lateral C-arm images.  The threaded guidepin was then overdrilled with the proximal femoral entry reamer.  A ball-tipped guidewire was then advanced down the intramedullary canal, across the fracture, and down the femoral shaft to the knee.  The ball tip guidewire's position was confirmed at both the knee and hip via C-arm imaging. A depth gauge was used to measure the length of the long nail to be used. It was measured to be 340 mm.  Sequential reamers were used to prepare the femoral canal.  The actual nail was then inserted into the proximal femur, across the fracture site and down the femoral shaft. Its position was confirmed on AP and lateral C-arm images.  The ball tip guidewire was removed.  Once the nail was completely seated, the drill guide for the lag screw was placed through the guide arm for the Affixus nail. A guidepin was then placed through this drill guide and advanced through the lateral cortex of the femur, across the fracture site and into the femoral head achieving a tip apex distance of less than 25 mm. The length of the drill pin was measured to be 95  mm, and then the drill for the lag screw was advanced through the lateral cortex, across the fracture site and up into the femoral head to the depth of the lag screw.  The lag screw was then advanced by hand into position across the fracture site into the femoral head. Its final position was confirmed on AP and lateral C-arm images.  Compression was applied as traction was carefully released. The set screw in the top of the intramedullary rod was tightened by hand using a screwdriver.  The attention was then turned to placement of the distal interlocking screws. A perfect circle technique was used. 2 small stab incisions were made over the distal interlocking screw holes.  A free hand technique was used to drill both distal interlocking screws. The depth of the screw holes was measured with a depth gauge. The 27mm and 90mm screws were then advanced into position and tightened by hand. Final C-arm images of the entire intramedullary construct were taken in both the AP and lateral planes.   The wounds were irrigated copiously and closed with 0 Vicryl for closure of the deep fascia and 2-0 Vicryl for subcutaneous closure. The skin was approximated with staples. A dry sterile dressing was applied. I was scrubbed and present the entire case and all sharp, sponge and instrument counts were correct at the conclusion of the case. Patient was transferred to a hospital bed and brought to PACU in stable condition.     Timoteo Gaul, MD

## 2020-01-31 NOTE — Progress Notes (Signed)
Initial Nutrition Assessment  DOCUMENTATION CODES:   Underweight  INTERVENTION:  Once diet is advanced provide Ensure Enlive po BID, each supplement provides 350 kcal and 20 grams of protein.  Provide MVI daily.  NUTRITION DIAGNOSIS:   Increased nutrient needs related to post-op healing as evidenced by estimated needs.  GOAL:   Patient will meet greater than or equal to 90% of their needs  MONITOR:   Diet advancement, PO intake, Supplement acceptance, Labs, Weight trends, I & O's, Skin  REASON FOR ASSESSMENT:   Consult Assessment of nutrition requirement/status  ASSESSMENT:   86 year old female with PMHx of HTN, anxiety, Parkinson's disease admitted after a fall at home found to have right subtrochanteric femur fracture.   RD unable to meet with patient as she went straight from ED to OR. Patient was NPO for OR today. Will monitor for diet advancement. Patient will benefit from oral nutrition supplements with diet advancement and has increased nutrient needs for post-operative healing. Patient is at risk for malnutrition but unable to determine if patient meets criteria for malnutrition at this time.  According to chart patient was 55.3 kg on 06/06/2019. She is now documented to be 50.8 kg. She has lost 4.5 kg (8.1% body weight) over the past 8 months, which is not significant for time frame but is still concerning.  RD will obtain full nutrition/weight history and NFPE on follow-up.  Medications reviewed and include: NS at 75 mL/hr.  Labs reviewed: Sodium 131, Chloride 94, BUN 25.  NUTRITION - FOCUSED PHYSICAL EXAM:  Unable to complete at this time.  Diet Order:   Diet Order            Diet NPO time specified Except for: Sips with Meds  Diet effective now                EDUCATION NEEDS:   No education needs have been identified at this time  Skin:  Skin Assessment:  (RN skin assessment not completed at this time)  Last BM:  Unknown  Height:   Ht  Readings from Last 1 Encounters:  01/31/20 5\' 6"  (1.676 m)   Weight:   Wt Readings from Last 1 Encounters:  01/31/20 50.8 kg   BMI:  Body mass index is 18.08 kg/m.  Estimated Nutritional Needs:   Kcal:  1500-1700  Protein:  75-85 grams  Fluid:  1.3-1.5 L/day  Jacklynn Barnacle, MS, RD, LDN Pager number available on Amion

## 2020-01-31 NOTE — ED Notes (Signed)
Date and time results received: 01/31/20 8:58 AM (use smartphrase ".now" to insert current time)  Test: troponin  Critical Value: 139  Name of Provider Notified: Dr. Enzo Bi

## 2020-01-31 NOTE — ED Notes (Signed)
Patient taken to OR at this time- Vancomycin sent with patient.

## 2020-01-31 NOTE — ED Notes (Signed)
Consent for surgery obtained and placed on chart. Pt requesting that son signs for her after she attempted to sign for herself. Son at bedside, power of attorney per son and pt. Pt unable to sign due to inability to see paper and shaking with her hx of Parkinson's

## 2020-01-31 NOTE — ED Notes (Signed)
Pt was informed by ortho that surgery around 1300-1400 today. Will give ordered ABX 1 hour prior to surgery as ordered.

## 2020-01-31 NOTE — H&P (Signed)
History and Physical    Kara Levine:[id number] DOB: 07-12-1931 DOA: 01/30/2020  PCP: Kirk Ruths, MD   Patient coming from: Assisted living  I have personally briefly reviewed patient's old medical records in [place]  Chief Complaint: Fall  HPI: Kara Levine is a 86 y.o. female with medical history significant for hypertension, pacemaker placement 2010 for tachybradycardia syndrome and Parkinson's  who presents to the emergency room following a mechanical fall.  Patient stated that she has numbness in her feet and loss of balance falling backwards, hitting her head and right hip, sustaining laceration to the scalp.  Was unable to get up after the fall.  Denies preceding lightheadedness visual disturbance or one-sided weakness or preceding chest pain, shortness of breath or palpitations.  Was previously in her usual state of health ED Course: On arrival she was hypertensive at 205/90 mildly tachycardic at 101 but with otherwise normal vitals.  Blood work showed mild hyponatremia of 131, leukocytosis of 16,000, troponin 70.  CT head and C-spine with no acute injury chest x-ray with no acute disease.  Hip x-ray showed acute displaced fracture proximal right femur.  ER provider spoke with orthopedist Dr. Nehemiah Massed will take patient to the OR in the a.m.  Hospitalist consulted for admission. Review of Systems: As per HPI otherwise all other systems on review of systems negative.    Past Medical History:  Diagnosis Date  . Anxiety   . Hypertension   . Parkinson's disease [facility])     Past Surgical History:  Procedure Laterality Date  . ABDOMINAL HYSTERECTOMY    . APPENDECTOMY    . ELBOW FRACTURE SURGERY Bilateral   . EYE SURGERY    . PACEMAKER IMPLANT       reports that she has never smoked. She has never used smokeless tobacco. She reports that she does not drink alcohol and does not use drugs.  Allergies  Allergen Reactions  . Codeine   . Keflex  [Cephalexin]   . Norco [Hydrocodone-Acetaminophen]   . Shellfish Allergy Nausea Only    Patient stated it was like food poison     Family History  Problem Relation Age of Onset  . Stroke Mother   . Hypertension Mother   . Diabetes Father   . Diabetes Sister       Prior to Admission medications   Medication Sig Start Date End Date Taking? Authorizing Provider  bisoprolol (ZEBETA) 5 MG tablet Take 5-10 mg by mouth 2 (two) times daily.     [provider]  busPIRone (BUSPAR) 5 MG tablet Take by mouth. 05/06/19 05/05/20  [provider]  carbidopa-levodopa (SINEMET IR) 25-100 MG tablet Take by mouth. 03/15/19 03/14/20  [provider]  Cranberry-Vitamin C (AZO CRANBERRY URINARY TRACT) 250-60 MG CAPS Take by mouth 2 (two) times daily.    [provider]  furosemide (LASIX) 40 MG tablet Take 40 mg by mouth 2 (two) times daily.    [provider]  hydrALAZINE (APRESOLINE) 25 MG tablet Take 25 mg by mouth 3 (three) times daily. 03/04/19   [provider]  hydrocortisone 2.5 % cream  10/10/18   [provider]  mirabegron ER (MYRBETRIQ) 50 MG TB24 tablet Take 1 tablet (50 mg total) by mouth daily. 06/06/19   Billey Co, MD  nitrofurantoin (MACRODANTIN) 100 MG capsule  06/04/19   [provider]  omeprazole (PRILOSEC) 20 MG capsule Take by mouth. 10/04/18 10/04/19  [provider]  oxazepam (  SERAX) 10 MG capsule Take 10 mg by mouth daily as needed for anxiety (raised blood pressure).    [provider]  potassium chloride (MICRO-K) 10 MEQ CR capsule Take 10 mEq by mouth daily.    [provider]  spironolactone (ALDACTONE) 25 MG tablet Take 25 mg by mouth daily. 06/08/19   [provider]    Physical Exam: Vitals:   01/30/20 2352 01/31/20 0001 01/31/20 0001  BP:  (!) 205/90 (!) 184/100  Pulse: (!) 101    Resp: 16    Temp: (!) 97.5 F (36.4 C)    TempSrc: Oral    SpO2: 95%        Vitals:   01/30/20 2352 01/31/20 0001 01/31/20 0001  BP:  (!) 205/90 (!) 184/100  Pulse: (!) 101    Resp: 16    Temp: (!) 97.5 F (36.4 C)    TempSrc: Oral    SpO2: 95%        Constitutional: Alert and oriented x 3 .  HEENT:      Head: Normocephalic and atraumatic.         Eyes: PERLA, EOMI, Conjunctivae are normal. Sclera is non-icteric.       Mouth/Throat: Mucous membranes are moist.       Neck: Supple with no signs of meningismus. Cardiovascular: Regular rate and rhythm. No murmurs, gallops, or rubs. 2+ symmetrical distal pulses are present . No JVD. No LE edema Respiratory: Respiratory effort normal .Lungs sounds clear bilaterally. No wheezes, crackles, or rhonchi.  Gastrointestinal: Soft, LLQ pain on palpation, and non distended with positive bowel sounds. No rebound or guarding. Genitourinary: No CVA tenderness. Musculoskeletal:  Right lower extremity shortened and externally rotated Neurologic: Normal speech and language. Face is symmetric. Moving all extremities. No gross focal neurologic deficits .  Mild parkinsonian tremor. Skin: Skin is warm, dry.  No rash or ulcers Psychiatric: Mood and affect are normal Speech and behavior are normal   Labs on Admission: I have personally reviewed following labs and imaging studies  CBC: Recent Labs  Lab 01/30/20 2346  WBC 16.0*  NEUTROABS 13.3*  HGB 11.9*  HCT 35.0*  MCV 89.5  PLT 053   Basic Metabolic Panel: Recent Labs  Lab 01/30/20 2346  NA 131*  K 4.0  CL 94*  CO2 25  GLUCOSE 132*  BUN 25*  CREATININE 0.91  CALCIUM 9.0   GFR: CrCl cannot be calculated (Unknown ideal weight.). Liver Function Tests: Recent Labs  Lab 01/30/20 2346  AST 29  ALT 11  ALKPHOS 101  BILITOT 0.8  PROT 7.1  ALBUMIN 4.3   No results for input(s): LIPASE, AMYLASE in the last 168 hours. No results for input(s): AMMONIA in the last 168 hours. Coagulation Profile: Recent Labs  Lab 01/30/20 2346  INR 1.0   Cardiac  Enzymes: No results for input(s): CKTOTAL, CKMB, CKMBINDEX, TROPONINI in the last 168 hours. BNP (last 3 results) No results for input(s): PROBNP in the last 8760 hours. HbA1C: No results for input(s): HGBA1C in the last 72 hours. CBG: No results for input(s): GLUCAP in the last 168 hours. Lipid Profile: No results for input(s): CHOL, HDL, LDLCALC, TRIG, CHOLHDL, LDLDIRECT in the last 72 hours. Thyroid Function Tests: No results for input(s): TSH, T4TOTAL, FREET4, T3FREE, THYROIDAB in the last 72 hours. Anemia Panel: No results for input(s): VITAMINB12, FOLATE, FERRITIN, TIBC, IRON, RETICCTPCT in the last 72 hours. Urine analysis:    Component Value Date/Time   COLORURINE YELLOW (A) 11/05/2019  0115   APPEARANCEUR CLOUDY (A) 11/05/2019 0115   APPEARANCEUR Clear 06/06/2019 1118   LABSPEC 1.012 11/05/2019 0115   PHURINE 9.0 (H) 11/05/2019 0115   GLUCOSEU NEGATIVE 11/05/2019 0115   HGBUR MODERATE (A) 11/05/2019 0115   BILIRUBINUR NEGATIVE 11/05/2019 0115   BILIRUBINUR Negative 06/06/2019 1118   KETONESUR NEGATIVE 11/05/2019 0115   PROTEINUR 100 (A) 11/05/2019 0115   NITRITE NEGATIVE 11/05/2019 0115   LEUKOCYTESUR LARGE (A) 11/05/2019 0115    Radiological Exams on Admission: DG Chest 1 View  Result Date: 01/30/2020 CLINICAL DATA:  Pain status post fall EXAM: CHEST  1 VIEW COMPARISON:  July 16, 2019 FINDINGS: There is a dual chamber left-sided pacemaker in place. The lungs are clear. There is no pneumothorax. No focal infiltrate. The heart size is borderline enlarged. Aortic calcifications are noted. The distal third of the right clavicle appears lucent which may be secondary to imaging technique. IMPRESSION: No acute cardiopulmonary process. Electronically Signed   By: Constance Holster M.D.   On: 01/30/2020 23:43   CT Head Wo Contrast  Result Date: 01/30/2020 CLINICAL DATA:  Head trauma resulting from a fall. EXAM: CT HEAD WITHOUT CONTRAST CT CERVICAL SPINE WITHOUT CONTRAST  TECHNIQUE: Multidetector CT imaging of the head and cervical spine was performed following the standard protocol without intravenous contrast. Multiplanar CT image reconstructions of the cervical spine were also generated. COMPARISON:  None. FINDINGS: CT HEAD FINDINGS Brain: Diffuse cerebral atrophy. Ventricular dilatation consistent with central atrophy. Low-attenuation changes in the deep white matter consistent with small vessel ischemia. No abnormal extra-axial fluid collections. No mass effect or midline shift. Gray-white matter junctions are distinct. Basal cisterns are not effaced. No acute intracranial hemorrhage. Vascular: Intracranial arterial vascular calcifications are present. Skull: The calvarium appears intact. Subcutaneous soft tissue scalp hematoma and laceration over the right posterior temporal region. Sinuses/Orbits: No acute finding. Other: None. CT CERVICAL SPINE FINDINGS Alignment: Slight anterior subluxation of C4 on C5. This is likely degenerative but ligamentous injury can have this appearance and can not be excluded entirely. Normal alignment of the posterior facet joints. C1-2 articulation appears intact. Skull base and vertebrae: Skull base appears intact. Degenerative changes demonstrated in the temporomandibular joints. No vertebral compression deformities. No focal bone lesions. Soft tissues and spinal canal: No prevertebral soft tissue swelling. No abnormal paraspinal soft tissue infiltration. Vascular calcifications. Disc levels: Degenerative changes diffusely throughout the cervical spine with narrowed interspaces and endplate hypertrophic changes. Degenerative changes throughout the cervical facet joints. Upper chest: Scarring and pleural thickening in the lung apices. Other: None. IMPRESSION: 1. No acute intracranial abnormalities. Chronic atrophy and small vessel ischemic changes. 2. Subcutaneous soft tissue scalp hematoma and laceration over the right posterior temporal region.  3. Slight anterior subluxation of C4 on C5 is likely degenerative but ligamentous injury can have this appearance and can not be excluded entirely. Diffuse degenerative changes throughout the cervical spine. No acute displaced fractures identified. Electronically Signed   By: Lucienne Capers M.D.   On: 01/30/2020 23:56   CT Cervical Spine Wo Contrast  Result Date: 01/30/2020 CLINICAL DATA:  Head trauma resulting from a fall. EXAM: CT HEAD WITHOUT CONTRAST CT CERVICAL SPINE WITHOUT CONTRAST TECHNIQUE: Multidetector CT imaging of the head and cervical spine was performed following the standard protocol without intravenous contrast. Multiplanar CT image reconstructions of the cervical spine were also generated. COMPARISON:  None. FINDINGS: CT HEAD FINDINGS Brain: Diffuse cerebral atrophy. Ventricular dilatation consistent with central atrophy. Low-attenuation changes in the deep white matter consistent  with small vessel ischemia. No abnormal extra-axial fluid collections. No mass effect or midline shift. Gray-white matter junctions are distinct. Basal cisterns are not effaced. No acute intracranial hemorrhage. Vascular: Intracranial arterial vascular calcifications are present. Skull: The calvarium appears intact. Subcutaneous soft tissue scalp hematoma and laceration over the right posterior temporal region. Sinuses/Orbits: No acute finding. Other: None. CT CERVICAL SPINE FINDINGS Alignment: Slight anterior subluxation of C4 on C5. This is likely degenerative but ligamentous injury can have this appearance and can not be excluded entirely. Normal alignment of the posterior facet joints. C1-2 articulation appears intact. Skull base and vertebrae: Skull base appears intact. Degenerative changes demonstrated in the temporomandibular joints. No vertebral compression deformities. No focal bone lesions. Soft tissues and spinal canal: No prevertebral soft tissue swelling. No abnormal paraspinal soft tissue infiltration.  Vascular calcifications. Disc levels: Degenerative changes diffusely throughout the cervical spine with narrowed interspaces and endplate hypertrophic changes. Degenerative changes throughout the cervical facet joints. Upper chest: Scarring and pleural thickening in the lung apices. Other: None. IMPRESSION: 1. No acute intracranial abnormalities. Chronic atrophy and small vessel ischemic changes. 2. Subcutaneous soft tissue scalp hematoma and laceration over the right posterior temporal region. 3. Slight anterior subluxation of C4 on C5 is likely degenerative but ligamentous injury can have this appearance and can not be excluded entirely. Diffuse degenerative changes throughout the cervical spine. No acute displaced fractures identified. Electronically Signed   By: Lucienne Capers M.D.   On: 01/30/2020 23:56   DG Hip Unilat With Pelvis 2-3 Views Right  Result Date: 01/30/2020 CLINICAL DATA:  Pain status post fall EXAM: DG HIP (WITH OR WITHOUT PELVIS) 2-3V RIGHT COMPARISON:  None. FINDINGS: There is an acute displaced intratrochanteric/subtrochanteric fracture of the proximal right femur. There is a medially displaced fracture fragment measuring approximately 2.7 cm. There are end-stage degenerative changes of the right hip. The patient is status post total hip arthroplasty on the left. There is diffuse osteopenia. IMPRESSION: 1. Acute displaced fracture of the proximal right femur as detailed above. 2. End-stage degenerative changes of the right hip. 3. Osteopenia. 4. Status post total hip arthroplasty on the left. Electronically Signed   By: Constance Holster M.D.   On: 01/30/2020 23:42    EKG: Independently reviewed. Interpretation : Sinus tachycardia with right bundle branch block   Assessment/Plan 86 year old female with history of hypertension, pacemaker placement and Parkinson's  who presents to the emergency room following a mechanical fall sustaining right hip fracture    Closed right hip  fracture (HCC)   Accidental fall   Preoperative clearance -Patient sustained an accidental fall -Extensive imaging with only injury being right hip fracture.  Head and C-spine CT negative for acute injury. -Low to moderate risk for perioperative cardiopulmonary events and can proceed with orthopedic repair    Laceration of scalp secondary to fall -Wound care  Left lower quadrant pain on examination -Follow-up CT abdomen to evaluate for internal injury    Essential hypertension -Labetalol as needed BP over 160/90 while n.p.o.    Primary Parkinsonism (HCC) -Continue Sinemet    S/P placement of cardiac pacemaker -No acute disease suspected at this time -Follows with Dr. Saralyn Pilar  DVT prophylaxis: SCDs Code Status: full code  Family Communication: Son at bedside Disposition Plan: Back to previous home environment Consults called: Dr. Mack Guise Status: Inpatient for inpatient only procedure    Athena Masse MD [facility]     01/31/2020, 1:09 AM

## 2020-01-31 NOTE — Progress Notes (Signed)
PROGRESS NOTE    Kara Levine  UEA:[id number] DOB: [birth year] DOA: 01/30/2020 PCP: Kirk Ruths, MD    Assessment & Plan:   Principal Problem:   Closed right hip fracture [facility]) Active Problems:   Essential hypertension   Primary Parkinsonism ([place])   S/P placement of cardiac pacemaker   Laceration of scalp   Accidental fall   Preoperative clearance    Kara Levine is a 86 y.o. Caucasian female with medical history significant for hypertension, pacemaker placement 2010 for tachybradycardia syndrome and Parkinson's who presented to the emergency room following a mechanical fall.   Closed right hip fracture (HCC)   Accidental fall   Preoperative clearance -Extensive imaging with only injury being right hip fracture.  Head and C-spine CT negative for acute injury. -Low to moderate risk for perioperative cardiopulmonary events and can proceed with orthopedic repair PLAN: --INTRAMEDULLARY (IM) NAIL INTERTROCHANTRIC this afternoon with Dr. Mack Guise --Pain control with PRN IV morphine and Norco --PT/OT     Laceration of scalp secondary to fall -Wound care  Left lower quadrant pain on examination -CT abdomen/pelvis on presentation showed "no obvious solid organ injury or hematoma" and "Gallbladder distention with small stones. No evidence of cholecystitis." --monitor for now    Essential hypertension -BP labile, likely due to pain.  Not on BP at home. --IV hydralazine PRN    Primary Parkinsonism (HCC) -Continue Sinemet    S/P placement of cardiac pacemaker -No acute disease suspected at this time -Follows with Dr. Saralyn Pilar  GERD --continue home PPI  Anxiety and depression --continue home Buspar and Remeron   DVT prophylaxis: Lovenox SQ Code Status: Full code  Family Communication: son updated at bedside today Status is: inpatient Dispo:   The patient is from: SNF Anticipated d/c is to: SNF Anticipated d/c date is: 2-3 days Patient  currently is not medically stable to d/c due to: just had OR surgery for hip fracture   Subjective and Interval History:  Pt complained of severe pain in her right hip.  Confused.     Objective: Vitals:   01/31/20 1750 01/31/20 1803 01/31/20 1815 01/31/20 1837  BP: 105/60  121/75 (!) 144/76  Pulse: 64 60 68 67  Resp: (!) 9 18 18 18   Temp:   98.1 F (36.7 C) (!) 97.3 F (36.3 C)  TempSrc:    Axillary  SpO2: 97% 100% 96% 100%  Weight:      Height:        Intake/Output Summary (Last 24 hours) at 01/31/2020 1839 Last data filed at 01/31/2020 1814 Gross per 24 hour  Intake 2350 ml  Output 1025 ml  Net 1325 ml   Filed Weights   01/31/20 1410  Weight: 50.8 kg    Examination:   Constitutional: in mild distress due to pain, alert HEENT: conjunctivae and lids normal, EOMI CV: RRR tachycardic, no M,R,G. Distal pulses +2.  No cyanosis.   RESP: CTA B/L, normal respiratory effort  GI: +BS, NTND Extremities: No effusions, edema in BLE SKIN: warm, dry and intact Neuro: II - XII grossly intact.  Sensation intact   Data Reviewed: I have personally reviewed following labs and imaging studies  CBC: Recent Labs  Lab 01/30/20 2346 01/31/20 0743  WBC 16.0* 13.4*  NEUTROABS 13.3*  --   HGB 11.9* 9.8*  HCT 35.0* 28.9*  MCV 89.5 90.6  PLT 233 478   Basic Metabolic Panel: Recent Labs  Lab 01/30/20 2346  NA 131*  K 4.0  CL 94*  CO2 25  GLUCOSE 132*  BUN 25*  CREATININE 0.91  CALCIUM 9.0   GFR: Estimated Creatinine Clearance: 35.6 mL/min (by C-G formula based on SCr of 0.91 mg/dL). Liver Function Tests: Recent Labs  Lab 01/30/20 2346  AST 29  ALT 11  ALKPHOS 101  BILITOT 0.8  PROT 7.1  ALBUMIN 4.3   No results for input(s): LIPASE, AMYLASE in the last 168 hours. No results for input(s): AMMONIA in the last 168 hours. Coagulation Profile: Recent Labs  Lab 01/30/20 2346  INR 1.0   Cardiac Enzymes: No results for input(s): CKTOTAL, CKMB, CKMBINDEX, TROPONINI  in the last 168 hours. BNP (last 3 results) No results for input(s): PROBNP in the last 8760 hours. HbA1C: No results for input(s): HGBA1C in the last 72 hours. CBG: No results for input(s): GLUCAP in the last 168 hours. Lipid Profile: No results for input(s): CHOL, HDL, LDLCALC, TRIG, CHOLHDL, LDLDIRECT in the last 72 hours. Thyroid Function Tests: No results for input(s): TSH, T4TOTAL, FREET4, T3FREE, THYROIDAB in the last 72 hours. Anemia Panel: No results for input(s): VITAMINB12, FOLATE, FERRITIN, TIBC, IRON, RETICCTPCT in the last 72 hours. Sepsis Labs: No results for input(s): PROCALCITON, LATICACIDVEN in the last 168 hours.  Recent Results (from the past 240 hour(s))  SARS Coronavirus 2 by RT PCR (hospital order, performed in [facility] hospital lab) Nasopharyngeal Nasopharyngeal Swab     Status: None   Collection Time: 01/30/20 11:46 PM   Specimen: Nasopharyngeal Swab  Result Value Ref Range Status   SARS Coronavirus 2 NEGATIVE NEGATIVE Final    Comment: (NOTE) SARS-CoV-2 target nucleic acids are NOT DETECTED.  The SARS-CoV-2 RNA is generally detectable in upper and lower respiratory specimens during the acute phase of infection. The lowest concentration of SARS-CoV-2 viral copies this assay can detect is 250 copies / mL. A negative result does not preclude SARS-CoV-2 infection and should not be used as the sole basis for treatment or other patient management decisions.  A negative result may occur with improper specimen collection / handling, submission of specimen other than nasopharyngeal swab, presence of viral mutation(s) within the areas targeted by this assay, and inadequate number of viral copies (<250 copies / mL). A negative result must be combined with clinical observations, patient history, and epidemiological information.  Fact Sheet for Patients:   StrictlyIdeas.no  Fact Sheet for Healthcare  Providers: BankingDealers.co.za  This test is not yet approved or  cleared by the [place] FDA and has been authorized for detection and/or diagnosis of SARS-CoV-2 by FDA under an Emergency Use Authorization (EUA).  This EUA will remain in effect (meaning this test can be used) for the duration of the COVID-19 declaration under Section 564(b)(1) of the Act, 21 U.S.C. section 360bbb-3(b)(1), unless the authorization is terminated or revoked sooner.  Performed at [facility], [address] [place],  [address]       Radiology Studies: CT ABDOMEN PELVIS WO CONTRAST  Result Date: 01/31/2020 CLINICAL DATA:  Left-sided abdominal pain after a fall EXAM: CT ABDOMEN AND PELVIS WITHOUT CONTRAST TECHNIQUE: Multidetector CT imaging of the abdomen and pelvis was performed following the standard protocol without IV contrast. COMPARISON:  None. FINDINGS: Lower chest: Lung bases are clear. Hepatobiliary: Unenhanced appearance of the liver is unremarkable. Gallbladder distention with small stones. No wall thickening. No bile duct dilatation. Pancreas: Unremarkable. No pancreatic ductal dilatation or surrounding inflammatory changes. Spleen: Normal in size without focal abnormality. Adrenals/Urinary Tract: No adrenal gland nodules. Pelvic right kidney.  No hydronephrosis or hydroureter. Bladder is decompressed with a Foley catheter. No stones identified. Stomach/Bowel: Stomach, small bowel, and colon are not abnormally distended. Scattered stool throughout the colon. No wall thickening is appreciated. Vascular/Lymphatic: Extensive vascular calcifications. Splenic artery aneurysm measuring 1.1 cm diameter. No significant lymphadenopathy. Reproductive: Limited visualization of the low pelvis due to streak artifact from left hip prosthesis. Other: No free air or free fluid identified in the abdomen. Musculoskeletal: Comminuted and displaced inter trochanteric fractures of  the right hip with displaced lesser trochanteric fragment and varus angulation. Diffuse bone demineralization. Old left hip arthroplasty. Old appearing right rib fractures. Sclerosis in the sacrum may indicate stress or insufficiency fracture. Healing fracture suggested at the junction of S3 and S4. Degenerative changes in the spine. Evaluation of solid organs and vascular structures is limited without IV contrast material. IMPRESSION: 1. Evaluation of solid organs and vascular structures is limited without IV contrast material but no obvious solid organ injury or hematoma. 2. Acute comminuted and displaced inter trochanteric fractures of the right hip. 3. Healing fracture suggested at the junction of S3 and S4. Sclerosis in the sacral ala may indicate stress or insufficiency fracture. 4. Gallbladder distention with small stones. No evidence of cholecystitis. 5. Aortic atherosclerosis. Electronically Signed   By: Lucienne Capers M.D.   On: 01/31/2020 06:41   DG Chest 1 View  Result Date: 01/30/2020 CLINICAL DATA:  Pain status post fall EXAM: CHEST  1 VIEW COMPARISON:  July 16, 2019 FINDINGS: There is a dual chamber left-sided pacemaker in place. The lungs are clear. There is no pneumothorax. No focal infiltrate. The heart size is borderline enlarged. Aortic calcifications are noted. The distal third of the right clavicle appears lucent which may be secondary to imaging technique. IMPRESSION: No acute cardiopulmonary process. Electronically Signed   By: Constance Holster M.D.   On: 01/30/2020 23:43   CT Head Wo Contrast  Result Date: 01/30/2020 CLINICAL DATA:  Head trauma resulting from a fall. EXAM: CT HEAD WITHOUT CONTRAST CT CERVICAL SPINE WITHOUT CONTRAST TECHNIQUE: Multidetector CT imaging of the head and cervical spine was performed following the standard protocol without intravenous contrast. Multiplanar CT image reconstructions of the cervical spine were also generated. COMPARISON:  None. FINDINGS:  CT HEAD FINDINGS Brain: Diffuse cerebral atrophy. Ventricular dilatation consistent with central atrophy. Low-attenuation changes in the deep white matter consistent with small vessel ischemia. No abnormal extra-axial fluid collections. No mass effect or midline shift. Gray-white matter junctions are distinct. Basal cisterns are not effaced. No acute intracranial hemorrhage. Vascular: Intracranial arterial vascular calcifications are present. Skull: The calvarium appears intact. Subcutaneous soft tissue scalp hematoma and laceration over the right posterior temporal region. Sinuses/Orbits: No acute finding. Other: None. CT CERVICAL SPINE FINDINGS Alignment: Slight anterior subluxation of C4 on C5. This is likely degenerative but ligamentous injury can have this appearance and can not be excluded entirely. Normal alignment of the posterior facet joints. C1-2 articulation appears intact. Skull base and vertebrae: Skull base appears intact. Degenerative changes demonstrated in the temporomandibular joints. No vertebral compression deformities. No focal bone lesions. Soft tissues and spinal canal: No prevertebral soft tissue swelling. No abnormal paraspinal soft tissue infiltration. Vascular calcifications. Disc levels: Degenerative changes diffusely throughout the cervical spine with narrowed interspaces and endplate hypertrophic changes. Degenerative changes throughout the cervical facet joints. Upper chest: Scarring and pleural thickening in the lung apices. Other: None. IMPRESSION: 1. No acute intracranial abnormalities. Chronic atrophy and small vessel ischemic changes. 2. Subcutaneous soft tissue scalp  hematoma and laceration over the right posterior temporal region. 3. Slight anterior subluxation of C4 on C5 is likely degenerative but ligamentous injury can have this appearance and can not be excluded entirely. Diffuse degenerative changes throughout the cervical spine. No acute displaced fractures identified.  Electronically Signed   By: Lucienne Capers M.D.   On: 01/30/2020 23:56   CT Cervical Spine Wo Contrast  Result Date: 01/30/2020 CLINICAL DATA:  Head trauma resulting from a fall. EXAM: CT HEAD WITHOUT CONTRAST CT CERVICAL SPINE WITHOUT CONTRAST TECHNIQUE: Multidetector CT imaging of the head and cervical spine was performed following the standard protocol without intravenous contrast. Multiplanar CT image reconstructions of the cervical spine were also generated. COMPARISON:  None. FINDINGS: CT HEAD FINDINGS Brain: Diffuse cerebral atrophy. Ventricular dilatation consistent with central atrophy. Low-attenuation changes in the deep white matter consistent with small vessel ischemia. No abnormal extra-axial fluid collections. No mass effect or midline shift. Gray-white matter junctions are distinct. Basal cisterns are not effaced. No acute intracranial hemorrhage. Vascular: Intracranial arterial vascular calcifications are present. Skull: The calvarium appears intact. Subcutaneous soft tissue scalp hematoma and laceration over the right posterior temporal region. Sinuses/Orbits: No acute finding. Other: None. CT CERVICAL SPINE FINDINGS Alignment: Slight anterior subluxation of C4 on C5. This is likely degenerative but ligamentous injury can have this appearance and can not be excluded entirely. Normal alignment of the posterior facet joints. C1-2 articulation appears intact. Skull base and vertebrae: Skull base appears intact. Degenerative changes demonstrated in the temporomandibular joints. No vertebral compression deformities. No focal bone lesions. Soft tissues and spinal canal: No prevertebral soft tissue swelling. No abnormal paraspinal soft tissue infiltration. Vascular calcifications. Disc levels: Degenerative changes diffusely throughout the cervical spine with narrowed interspaces and endplate hypertrophic changes. Degenerative changes throughout the cervical facet joints. Upper chest: Scarring and  pleural thickening in the lung apices. Other: None. IMPRESSION: 1. No acute intracranial abnormalities. Chronic atrophy and small vessel ischemic changes. 2. Subcutaneous soft tissue scalp hematoma and laceration over the right posterior temporal region. 3. Slight anterior subluxation of C4 on C5 is likely degenerative but ligamentous injury can have this appearance and can not be excluded entirely. Diffuse degenerative changes throughout the cervical spine. No acute displaced fractures identified. Electronically Signed   By: Lucienne Capers M.D.   On: 01/30/2020 23:56   DG HIP OPERATIVE UNILAT W OR W/O PELVIS RIGHT  Result Date: 01/31/2020 CLINICAL DATA:  Right hip fracture EXAM: OPERATIVE RIGHT HIP WITH PELVIS COMPARISON:  01/30/2020 FLUOROSCOPY TIME:  Radiation Exposure Index (as provided by the fluoroscopic device): Not available If the device does not provide the exposure index: Fluoroscopy Time:  3 minutes 5 seconds Number of Acquired Images:  4 FINDINGS: Medullary rod is noted in the right femur. Compression screw extending across the femoral neck is noted. Fracture fragments are in near anatomic alignment. Distal fixation screws are noted as well. IMPRESSION: Status post ORIF of the right femoral fracture. Electronically Signed   By: Inez Catalina M.D.   On: 01/31/2020 16:58   DG Hip Unilat With Pelvis 2-3 Views Right  Result Date: 01/30/2020 CLINICAL DATA:  Pain status post fall EXAM: DG HIP (WITH OR WITHOUT PELVIS) 2-3V RIGHT COMPARISON:  None. FINDINGS: There is an acute displaced intratrochanteric/subtrochanteric fracture of the proximal right femur. There is a medially displaced fracture fragment measuring approximately 2.7 cm. There are end-stage degenerative changes of the right hip. The patient is status post total hip arthroplasty on the left. There is diffuse  osteopenia. IMPRESSION: 1. Acute displaced fracture of the proximal right femur as detailed above. 2. End-stage degenerative changes of  the right hip. 3. Osteopenia. 4. Status post total hip arthroplasty on the left. Electronically Signed   By: Constance Holster M.D.   On: 01/30/2020 23:42     Scheduled Meds: . [START ON 02/01/2020] feeding supplement (ENSURE ENLIVE)  237 mL Oral BID BM  . [START ON 02/01/2020] multivitamin with minerals  1 tablet Oral Daily   Continuous Infusions: . sodium chloride 75 mL/hr at 01/31/20 1802  . methocarbamol (ROBAXIN) IV Stopped (01/31/20 1204)     LOS: 0 days     Enzo Bi, MD [facility] If 7PM-7AM, please contact night-coverage 01/31/2020, 6:39 PM

## 2020-01-31 NOTE — Consult Note (Signed)
ORTHOPAEDIC CONSULTATION  REQUESTING PHYSICIAN: Enzo Bi, MD  Chief Complaint: Right hip and thigh pain status post fall  HPI: Kara Levine is a 86 y.o. female presented overnight to the emergency department after a fall at home.  Patient describes neuropathy in her lower extremities and believes this contributed to her falling backwards.  Patient was seen in the emergency department this morning with her son at the bedside.  Patient is complaining of muscle spasms in the right thigh.  Past Medical History:  Diagnosis Date  . Anxiety   . Hypertension   . Parkinson's disease [facility])    Past Surgical History:  Procedure Laterality Date  . ABDOMINAL HYSTERECTOMY    . APPENDECTOMY    . ELBOW FRACTURE SURGERY Bilateral   . EYE SURGERY    . PACEMAKER IMPLANT     Social History   Socioeconomic History  . Marital status: Widowed    Spouse name: Not on file  . Number of children: Not on file  . Years of education: Not on file  . Highest education level: Not on file  Occupational History  . Not on file  Tobacco Use  . Smoking status: Never Smoker  . Smokeless tobacco: Never Used  Substance and Sexual Activity  . Alcohol use: Never  . Drug use: Never  . Sexual activity: Not on file  Other Topics Concern  . Not on file  Social History Narrative  . Not on file   Social Determinants of Health   Financial Resource Strain:   . Difficulty of Paying Living Expenses:   Food Insecurity:   . Worried About Charity fundraiser in the Last Year:   . Arboriculturist in the Last Year:   Transportation Needs:   . Film/video editor (Medical):   Marland Kitchen Lack of Transportation (Non-Medical):   Physical Activity:   . Days of Exercise per Week:   . Minutes of Exercise per Session:   Stress:   . Feeling of Stress :   Social Connections:   . Frequency of Communication with Friends and Family:   . Frequency of Social Gatherings with Friends and Family:   . Attends Religious  Services:   . Active Member of Clubs or Organizations:   . Attends Archivist Meetings:   Marland Kitchen Marital Status:    Family History  Problem Relation Age of Onset  . Stroke Mother   . Hypertension Mother   . Diabetes Father   . Diabetes Sister    Allergies  Allergen Reactions  . Codeine   . Keflex [Cephalexin]   . Norco [Hydrocodone-Acetaminophen]   . Shellfish Allergy Nausea Only    Patient stated it was like food poison    Prior to Admission medications   Medication Sig Start Date End Date Taking? Authorizing Provider  acetaminophen (TYLENOL) 325 MG tablet Take 650 mg by mouth every 4 (four) hours as needed.   Yes [provider]  aspirin 81 MG chewable tablet Chew 81 mg by mouth daily.   Yes [provider]  busPIRone (BUSPAR) 15 MG tablet Take 15 mg by mouth daily.  05/06/19 05/05/20 Yes [provider]  carbidopa-levodopa (SINEMET IR) 25-100 MG tablet Take 0.5 tablets by mouth 3 (three) times daily.  03/15/19 03/14/20 Yes [provider]  Cranberry-Vitamin C (AZO CRANBERRY URINARY TRACT) 250-60 MG CAPS Take by mouth 2 (two) times daily.   Yes [provider]  hydrALAZINE (APRESOLINE) 25 MG tablet Take 25 mg  by mouth daily as needed (as needed for SBP >210.).   Yes [provider]  loratadine (CLARITIN) 10 MG tablet Take 10 mg by mouth daily as needed.   Yes [provider]  mirtazapine (REMERON) 7.5 MG tablet Take 7.5 mg by mouth at bedtime.  01/14/20  Yes [provider]  triamcinolone cream (KENALOG) 0.1 % Apply 1 application topically 2 (two) times daily as needed.   Yes [provider]  omeprazole (PRILOSEC) 20 MG capsule Take by mouth. 10/04/18 10/04/19  [provider]   CT ABDOMEN PELVIS WO CONTRAST  Result Date: 01/31/2020 CLINICAL DATA:  Left-sided abdominal pain after a fall EXAM: CT ABDOMEN AND PELVIS WITHOUT CONTRAST TECHNIQUE: Multidetector CT imaging of the abdomen and pelvis was  performed following the standard protocol without IV contrast. COMPARISON:  None. FINDINGS: Lower chest: Lung bases are clear. Hepatobiliary: Unenhanced appearance of the liver is unremarkable. Gallbladder distention with small stones. No wall thickening. No bile duct dilatation. Pancreas: Unremarkable. No pancreatic ductal dilatation or surrounding inflammatory changes. Spleen: Normal in size without focal abnormality. Adrenals/Urinary Tract: No adrenal gland nodules. Pelvic right kidney. No hydronephrosis or hydroureter. Bladder is decompressed with a Foley catheter. No stones identified. Stomach/Bowel: Stomach, small bowel, and colon are not abnormally distended. Scattered stool throughout the colon. No wall thickening is appreciated. Vascular/Lymphatic: Extensive vascular calcifications. Splenic artery aneurysm measuring 1.1 cm diameter. No significant lymphadenopathy. Reproductive: Limited visualization of the low pelvis due to streak artifact from left hip prosthesis. Other: No free air or free fluid identified in the abdomen. Musculoskeletal: Comminuted and displaced inter trochanteric fractures of the right hip with displaced lesser trochanteric fragment and varus angulation. Diffuse bone demineralization. Old left hip arthroplasty. Old appearing right rib fractures. Sclerosis in the sacrum may indicate stress or insufficiency fracture. Healing fracture suggested at the junction of S3 and S4. Degenerative changes in the spine. Evaluation of solid organs and vascular structures is limited without IV contrast material. IMPRESSION: 1. Evaluation of solid organs and vascular structures is limited without IV contrast material but no obvious solid organ injury or hematoma. 2. Acute comminuted and displaced inter trochanteric fractures of the right hip. 3. Healing fracture suggested at the junction of S3 and S4. Sclerosis in the sacral ala may indicate stress or insufficiency fracture. 4. Gallbladder distention  with small stones. No evidence of cholecystitis. 5. Aortic atherosclerosis. Electronically Signed   By: Lucienne Capers M.D.   On: 01/31/2020 06:41   DG Chest 1 View  Result Date: 01/30/2020 CLINICAL DATA:  Pain status post fall EXAM: CHEST  1 VIEW COMPARISON:  July 16, 2019 FINDINGS: There is a dual chamber left-sided pacemaker in place. The lungs are clear. There is no pneumothorax. No focal infiltrate. The heart size is borderline enlarged. Aortic calcifications are noted. The distal third of the right clavicle appears lucent which may be secondary to imaging technique. IMPRESSION: No acute cardiopulmonary process. Electronically Signed   By: Constance Holster M.D.   On: 01/30/2020 23:43   CT Head Wo Contrast  Result Date: 01/30/2020 CLINICAL DATA:  Head trauma resulting from a fall. EXAM: CT HEAD WITHOUT CONTRAST CT CERVICAL SPINE WITHOUT CONTRAST TECHNIQUE: Multidetector CT imaging of the head and cervical spine was performed following the standard protocol without intravenous contrast. Multiplanar CT image reconstructions of the cervical spine were also generated. COMPARISON:  None. FINDINGS: CT HEAD FINDINGS Brain: Diffuse cerebral atrophy. Ventricular dilatation consistent with central atrophy. Low-attenuation changes in the deep white matter consistent  with small vessel ischemia. No abnormal extra-axial fluid collections. No mass effect or midline shift. Gray-white matter junctions are distinct. Basal cisterns are not effaced. No acute intracranial hemorrhage. Vascular: Intracranial arterial vascular calcifications are present. Skull: The calvarium appears intact. Subcutaneous soft tissue scalp hematoma and laceration over the right posterior temporal region. Sinuses/Orbits: No acute finding. Other: None. CT CERVICAL SPINE FINDINGS Alignment: Slight anterior subluxation of C4 on C5. This is likely degenerative but ligamentous injury can have this appearance and can not be excluded entirely.  Normal alignment of the posterior facet joints. C1-2 articulation appears intact. Skull base and vertebrae: Skull base appears intact. Degenerative changes demonstrated in the temporomandibular joints. No vertebral compression deformities. No focal bone lesions. Soft tissues and spinal canal: No prevertebral soft tissue swelling. No abnormal paraspinal soft tissue infiltration. Vascular calcifications. Disc levels: Degenerative changes diffusely throughout the cervical spine with narrowed interspaces and endplate hypertrophic changes. Degenerative changes throughout the cervical facet joints. Upper chest: Scarring and pleural thickening in the lung apices. Other: None. IMPRESSION: 1. No acute intracranial abnormalities. Chronic atrophy and small vessel ischemic changes. 2. Subcutaneous soft tissue scalp hematoma and laceration over the right posterior temporal region. 3. Slight anterior subluxation of C4 on C5 is likely degenerative but ligamentous injury can have this appearance and can not be excluded entirely. Diffuse degenerative changes throughout the cervical spine. No acute displaced fractures identified. Electronically Signed   By: Lucienne Capers M.D.   On: 01/30/2020 23:56   CT Cervical Spine Wo Contrast  Result Date: 01/30/2020 CLINICAL DATA:  Head trauma resulting from a fall. EXAM: CT HEAD WITHOUT CONTRAST CT CERVICAL SPINE WITHOUT CONTRAST TECHNIQUE: Multidetector CT imaging of the head and cervical spine was performed following the standard protocol without intravenous contrast. Multiplanar CT image reconstructions of the cervical spine were also generated. COMPARISON:  None. FINDINGS: CT HEAD FINDINGS Brain: Diffuse cerebral atrophy. Ventricular dilatation consistent with central atrophy. Low-attenuation changes in the deep white matter consistent with small vessel ischemia. No abnormal extra-axial fluid collections. No mass effect or midline shift. Gray-white matter junctions are distinct. Basal  cisterns are not effaced. No acute intracranial hemorrhage. Vascular: Intracranial arterial vascular calcifications are present. Skull: The calvarium appears intact. Subcutaneous soft tissue scalp hematoma and laceration over the right posterior temporal region. Sinuses/Orbits: No acute finding. Other: None. CT CERVICAL SPINE FINDINGS Alignment: Slight anterior subluxation of C4 on C5. This is likely degenerative but ligamentous injury can have this appearance and can not be excluded entirely. Normal alignment of the posterior facet joints. C1-2 articulation appears intact. Skull base and vertebrae: Skull base appears intact. Degenerative changes demonstrated in the temporomandibular joints. No vertebral compression deformities. No focal bone lesions. Soft tissues and spinal canal: No prevertebral soft tissue swelling. No abnormal paraspinal soft tissue infiltration. Vascular calcifications. Disc levels: Degenerative changes diffusely throughout the cervical spine with narrowed interspaces and endplate hypertrophic changes. Degenerative changes throughout the cervical facet joints. Upper chest: Scarring and pleural thickening in the lung apices. Other: None. IMPRESSION: 1. No acute intracranial abnormalities. Chronic atrophy and small vessel ischemic changes. 2. Subcutaneous soft tissue scalp hematoma and laceration over the right posterior temporal region. 3. Slight anterior subluxation of C4 on C5 is likely degenerative but ligamentous injury can have this appearance and can not be excluded entirely. Diffuse degenerative changes throughout the cervical spine. No acute displaced fractures identified. Electronically Signed   By: Lucienne Capers M.D.   On: 01/30/2020 23:56   DG Hip Unilat With Pelvis  2-3 Views Right  Result Date: 01/30/2020 CLINICAL DATA:  Pain status post fall EXAM: DG HIP (WITH OR WITHOUT PELVIS) 2-3V RIGHT COMPARISON:  None. FINDINGS: There is an acute displaced  intratrochanteric/subtrochanteric fracture of the proximal right femur. There is a medially displaced fracture fragment measuring approximately 2.7 cm. There are end-stage degenerative changes of the right hip. The patient is status post total hip arthroplasty on the left. There is diffuse osteopenia. IMPRESSION: 1. Acute displaced fracture of the proximal right femur as detailed above. 2. End-stage degenerative changes of the right hip. 3. Osteopenia. 4. Status post total hip arthroplasty on the left. Electronically Signed   By: Constance Holster M.D.   On: 01/30/2020 23:42    Positive ROS: All other systems have been reviewed and were otherwise negative with the exception of those mentioned in the HPI and as above.  Physical Exam: General: Alert, no acute distress Cardiovascular: No pedal edema Respiratory: No cyanosis, no use of accessory musculature GI: No organomegaly, abdomen is soft and non-tender Skin: No lesions in the area of chief complaint Neurologic: Sensation intact distally Psychiatric: Patient is competent for consent with normal mood and affect Lymphatic: No axillary or cervical lymphadenopathy  MUSCULOSKELETAL: Right lower extremity: Patient has shortening and mild external rotation to the right lower extremity.  She has palpable pedal pulses and intact motor function distally but has diminished sensation to light touch below her knees in both lower extremities.  Her skin overlying the right hip and thigh is intact.  There is no erythema ecchymosis or significant swelling.  There compartments are soft and compressible.  Assessment: Right subtrochanteric femur fracture  Plan: I explained to the patient and her son the nature of her fracture.  I recommended that the patient's fracture be treated with intramedullary fixation.  I discussed the details of the operation as well as the postoperative course.  I discussed the risks and benefits of surgery. The risks include but are  not limited to infection, bleeding requiring blood transfusion, nerve or blood vessel injury, joint stiffness or loss of motion, persistent pain, weakness or instability, malunion, nonunion and hardware failure and the need for further surgery. Medical risks include but are not limited to DVT and pulmonary embolism, myocardial infarction, stroke, pneumonia, respiratory failure and death. Patient and her son understood these risks and wished to proceed.   I reviewed the labs and radiographic studies in preparation for the case.  The patient was admitted to the hospital service overnight for preoperative clearance.  Dr. Damita Dunnings has cleared the patient for surgery.    Thornton Park, MD    01/31/2020 1:12 PM

## 2020-01-31 NOTE — ED Notes (Signed)
Pt son at bedside. No orthopedic consult has been done at this point.

## 2020-01-31 NOTE — Transfer of Care (Signed)
Immediate Anesthesia Transfer of Care Note  Patient: Kara Levine  Procedure(s) Performed: INTRAMEDULLARY (IM) NAIL INTERTROCHANTRIC (Right )  Patient Location: PACU  Anesthesia Type:Spinal  Level of Consciousness: sedated  Airway & Oxygen Therapy: Patient Spontanous Breathing  Post-op Assessment: Report given to RN and Post -op Vital signs reviewed and stable  Post vital signs: Reviewed  Last Vitals:  Vitals Value Taken Time  BP    Temp    Pulse    Resp    SpO2      Last Pain:  Vitals:   01/31/20 1410  TempSrc: Tympanic  PainSc: 4       Patients Stated Pain Goal: 0 (16/24/46 [id number])  Complications: No complications documented.

## 2020-02-01 LAB — CBC
HCT: 20.9 % — ABNORMAL LOW (ref 36.0–46.0)
Hemoglobin: 7 g/dL — ABNORMAL LOW (ref 12.0–15.0)
MCH: 30.7 pg (ref 26.0–34.0)
MCHC: 33.5 g/dL (ref 30.0–36.0)
MCV: 91.7 fL (ref 80.0–100.0)
Platelets: 138 10*3/uL — ABNORMAL LOW (ref 150–400)
RBC: 2.28 MIL/uL — ABNORMAL LOW (ref 3.87–5.11)
RDW: 12.5 % (ref 11.5–15.5)
WBC: 13.3 10*3/uL — ABNORMAL HIGH (ref 4.0–10.5)
nRBC: 0 % (ref 0.0–0.2)

## 2020-02-01 LAB — BASIC METABOLIC PANEL
Anion gap: 10 (ref 5–15)
BUN: 19 mg/dL (ref 8–23)
CO2: 23 mmol/L (ref 22–32)
Calcium: 8.4 mg/dL — ABNORMAL LOW (ref 8.9–10.3)
Chloride: 99 mmol/L (ref 98–111)
Creatinine, Ser: 0.74 mg/dL (ref 0.44–1.00)
GFR calc Af Amer: >60 mL/min (ref >60)
GFR calc non Af Amer: >60 mL/min (ref >60)
Glucose, Bld: 165 mg/dL — ABNORMAL HIGH (ref 70–99)
Potassium: 3.9 mmol/L (ref 3.5–5.1)
Sodium: 132 mmol/L — ABNORMAL LOW (ref 135–145)

## 2020-02-01 LAB — MAGNESIUM: Magnesium: 2 mg/dL (ref 1.7–2.4)

## 2020-02-01 MED ORDER — OXAZEPAM 15 MG PO CAPS
15.0000 mg | ORAL_CAPSULE | Freq: Two times a day (BID) | ORAL | Status: DC | PRN
  Filled 2020-02-01: qty 1

## 2020-02-01 MED ORDER — METHOCARBAMOL 500 MG PO TABS
500.0000 mg | ORAL_TABLET | Freq: Four times a day (QID) | ORAL | Status: DC
  Administered 2020-02-01 (×2): 500 mg via ORAL
  Filled 2020-02-01 (×4): qty 1

## 2020-02-01 MED ORDER — METHOCARBAMOL 1000 MG/10ML IJ SOLN
500.0000 mg | Freq: Four times a day (QID) | INTRAMUSCULAR | Status: DC
Start: 2020-02-01 — End: 2020-02-01

## 2020-02-01 NOTE — Progress Notes (Addendum)
Subjective:  Patient reports pain as moderate.  Follow commands. Son in room.  Objective:   VITALS:   Vitals:   02/01/20 0001 02/01/20 0427 02/01/20 0815 02/01/20 1604  BP: (!) 143/93 135/68 (!) 142/64 125/61  Pulse: 65 (!) 107 (!) 102 95  Resp: 19 20 19 17   Temp: 98 F (36.7 C)  98 F (36.7 C) 98.9 F (37.2 C)  TempSrc: Oral  Oral Oral  SpO2: 100% 93% 90% (!) 89%  Weight:      Height:        PHYSICAL EXAM:  Neurovascular intact Dorsiflexion/Plantar flexion intact Incision: dressing C/D/I No cellulitis present Compartment soft  LABS  Results for orders placed or performed during the hospital encounter of 01/30/20 (from the past 24 hour(s))  Basic metabolic panel     Status: Abnormal   Collection Time: 02/01/20  6:14 AM  Result Value Ref Range   Sodium 132 (L) 135 - 145 mmol/L   Potassium 3.9 3.5 - 5.1 mmol/L   Chloride 99 98 - 111 mmol/L   CO2 23 22 - 32 mmol/L   Glucose, Bld 165 (H) 70 - 99 mg/dL   BUN 19 8 - 23 mg/dL   Creatinine, Ser 0.74 0.44 - 1.00 mg/dL   Calcium 8.4 (L) 8.9 - 10.3 mg/dL   GFR calc non Af Amer >60 >60 mL/min   GFR calc Af Amer >60 >60 mL/min   Anion gap 10 5 - 15  CBC     Status: Abnormal   Collection Time: 02/01/20  6:14 AM  Result Value Ref Range   WBC 13.3 (H) 4.0 - 10.5 K/uL   RBC 2.28 (L) 3.87 - 5.11 MIL/uL   Hemoglobin 7.0 (L) 12.0 - 15.0 g/dL   HCT 20.9 (L) 36 - 46 %   MCV 91.7 80.0 - 100.0 fL   MCH 30.7 26.0 - 34.0 pg   MCHC 33.5 30.0 - 36.0 g/dL   RDW 12.5 11.5 - 15.5 %   Platelets 138 (L) 150 - 400 K/uL   nRBC 0.0 0.0 - 0.2 %  Magnesium     Status: None   Collection Time: 02/01/20  6:14 AM  Result Value Ref Range   Magnesium 2.0 1.7 - 2.4 mg/dL    CT ABDOMEN PELVIS WO CONTRAST  Result Date: 01/31/2020 CLINICAL DATA:  Left-sided abdominal pain after a fall EXAM: CT ABDOMEN AND PELVIS WITHOUT CONTRAST TECHNIQUE: Multidetector CT imaging of the abdomen and pelvis was performed following the standard protocol without IV  contrast. COMPARISON:  None. FINDINGS: Lower chest: Lung bases are clear. Hepatobiliary: Unenhanced appearance of the liver is unremarkable. Gallbladder distention with small stones. No wall thickening. No bile duct dilatation. Pancreas: Unremarkable. No pancreatic ductal dilatation or surrounding inflammatory changes. Spleen: Normal in size without focal abnormality. Adrenals/Urinary Tract: No adrenal gland nodules. Pelvic right kidney. No hydronephrosis or hydroureter. Bladder is decompressed with a Foley catheter. No stones identified. Stomach/Bowel: Stomach, small bowel, and colon are not abnormally distended. Scattered stool throughout the colon. No wall thickening is appreciated. Vascular/Lymphatic: Extensive vascular calcifications. Splenic artery aneurysm measuring 1.1 cm diameter. No significant lymphadenopathy. Reproductive: Limited visualization of the low pelvis due to streak artifact from left hip prosthesis. Other: No free air or free fluid identified in the abdomen. Musculoskeletal: Comminuted and displaced inter trochanteric fractures of the right hip with displaced lesser trochanteric fragment and varus angulation. Diffuse bone demineralization. Old left hip arthroplasty. Old appearing right rib fractures. Sclerosis in the sacrum may indicate  stress or insufficiency fracture. Healing fracture suggested at the junction of S3 and S4. Degenerative changes in the spine. Evaluation of solid organs and vascular structures is limited without IV contrast material. IMPRESSION: 1. Evaluation of solid organs and vascular structures is limited without IV contrast material but no obvious solid organ injury or hematoma. 2. Acute comminuted and displaced inter trochanteric fractures of the right hip. 3. Healing fracture suggested at the junction of S3 and S4. Sclerosis in the sacral ala may indicate stress or insufficiency fracture. 4. Gallbladder distention with small stones. No evidence of cholecystitis. 5.  Aortic atherosclerosis. Electronically Signed   By: Lucienne Capers M.D.   On: 01/31/2020 06:41   DG Chest 1 View  Result Date: 01/30/2020 CLINICAL DATA:  Pain status post fall EXAM: CHEST  1 VIEW COMPARISON:  July 16, 2019 FINDINGS: There is a dual chamber left-sided pacemaker in place. The lungs are clear. There is no pneumothorax. No focal infiltrate. The heart size is borderline enlarged. Aortic calcifications are noted. The distal third of the right clavicle appears lucent which may be secondary to imaging technique. IMPRESSION: No acute cardiopulmonary process. Electronically Signed   By: Constance Holster M.D.   On: 01/30/2020 23:43   CT Head Wo Contrast  Result Date: 01/30/2020 CLINICAL DATA:  Head trauma resulting from a fall. EXAM: CT HEAD WITHOUT CONTRAST CT CERVICAL SPINE WITHOUT CONTRAST TECHNIQUE: Multidetector CT imaging of the head and cervical spine was performed following the standard protocol without intravenous contrast. Multiplanar CT image reconstructions of the cervical spine were also generated. COMPARISON:  None. FINDINGS: CT HEAD FINDINGS Brain: Diffuse cerebral atrophy. Ventricular dilatation consistent with central atrophy. Low-attenuation changes in the deep white matter consistent with small vessel ischemia. No abnormal extra-axial fluid collections. No mass effect or midline shift. Gray-white matter junctions are distinct. Basal cisterns are not effaced. No acute intracranial hemorrhage. Vascular: Intracranial arterial vascular calcifications are present. Skull: The calvarium appears intact. Subcutaneous soft tissue scalp hematoma and laceration over the right posterior temporal region. Sinuses/Orbits: No acute finding. Other: None. CT CERVICAL SPINE FINDINGS Alignment: Slight anterior subluxation of C4 on C5. This is likely degenerative but ligamentous injury can have this appearance and can not be excluded entirely. Normal alignment of the posterior facet joints. C1-2  articulation appears intact. Skull base and vertebrae: Skull base appears intact. Degenerative changes demonstrated in the temporomandibular joints. No vertebral compression deformities. No focal bone lesions. Soft tissues and spinal canal: No prevertebral soft tissue swelling. No abnormal paraspinal soft tissue infiltration. Vascular calcifications. Disc levels: Degenerative changes diffusely throughout the cervical spine with narrowed interspaces and endplate hypertrophic changes. Degenerative changes throughout the cervical facet joints. Upper chest: Scarring and pleural thickening in the lung apices. Other: None. IMPRESSION: 1. No acute intracranial abnormalities. Chronic atrophy and small vessel ischemic changes. 2. Subcutaneous soft tissue scalp hematoma and laceration over the right posterior temporal region. 3. Slight anterior subluxation of C4 on C5 is likely degenerative but ligamentous injury can have this appearance and can not be excluded entirely. Diffuse degenerative changes throughout the cervical spine. No acute displaced fractures identified. Electronically Signed   By: Lucienne Capers M.D.   On: 01/30/2020 23:56   CT Cervical Spine Wo Contrast  Result Date: 01/30/2020 CLINICAL DATA:  Head trauma resulting from a fall. EXAM: CT HEAD WITHOUT CONTRAST CT CERVICAL SPINE WITHOUT CONTRAST TECHNIQUE: Multidetector CT imaging of the head and cervical spine was performed following the standard protocol without intravenous contrast. Multiplanar CT image  reconstructions of the cervical spine were also generated. COMPARISON:  None. FINDINGS: CT HEAD FINDINGS Brain: Diffuse cerebral atrophy. Ventricular dilatation consistent with central atrophy. Low-attenuation changes in the deep white matter consistent with small vessel ischemia. No abnormal extra-axial fluid collections. No mass effect or midline shift. Gray-white matter junctions are distinct. Basal cisterns are not effaced. No acute intracranial  hemorrhage. Vascular: Intracranial arterial vascular calcifications are present. Skull: The calvarium appears intact. Subcutaneous soft tissue scalp hematoma and laceration over the right posterior temporal region. Sinuses/Orbits: No acute finding. Other: None. CT CERVICAL SPINE FINDINGS Alignment: Slight anterior subluxation of C4 on C5. This is likely degenerative but ligamentous injury can have this appearance and can not be excluded entirely. Normal alignment of the posterior facet joints. C1-2 articulation appears intact. Skull base and vertebrae: Skull base appears intact. Degenerative changes demonstrated in the temporomandibular joints. No vertebral compression deformities. No focal bone lesions. Soft tissues and spinal canal: No prevertebral soft tissue swelling. No abnormal paraspinal soft tissue infiltration. Vascular calcifications. Disc levels: Degenerative changes diffusely throughout the cervical spine with narrowed interspaces and endplate hypertrophic changes. Degenerative changes throughout the cervical facet joints. Upper chest: Scarring and pleural thickening in the lung apices. Other: None. IMPRESSION: 1. No acute intracranial abnormalities. Chronic atrophy and small vessel ischemic changes. 2. Subcutaneous soft tissue scalp hematoma and laceration over the right posterior temporal region. 3. Slight anterior subluxation of C4 on C5 is likely degenerative but ligamentous injury can have this appearance and can not be excluded entirely. Diffuse degenerative changes throughout the cervical spine. No acute displaced fractures identified. Electronically Signed   By: Lucienne Capers M.D.   On: 01/30/2020 23:56   DG HIP OPERATIVE UNILAT W OR W/O PELVIS RIGHT  Result Date: 01/31/2020 CLINICAL DATA:  Right hip fracture EXAM: OPERATIVE RIGHT HIP WITH PELVIS COMPARISON:  01/30/2020 FLUOROSCOPY TIME:  Radiation Exposure Index (as provided by the fluoroscopic device): Not available If the device does not  provide the exposure index: Fluoroscopy Time:  3 minutes 5 seconds Number of Acquired Images:  4 FINDINGS: Medullary rod is noted in the right femur. Compression screw extending across the femoral neck is noted. Fracture fragments are in near anatomic alignment. Distal fixation screws are noted as well. IMPRESSION: Status post ORIF of the right femoral fracture. Electronically Signed   By: Inez Catalina M.D.   On: 01/31/2020 16:58   DG Hip Unilat With Pelvis 2-3 Views Right  Result Date: 01/30/2020 CLINICAL DATA:  Pain status post fall EXAM: DG HIP (WITH OR WITHOUT PELVIS) 2-3V RIGHT COMPARISON:  None. FINDINGS: There is an acute displaced intratrochanteric/subtrochanteric fracture of the proximal right femur. There is a medially displaced fracture fragment measuring approximately 2.7 cm. There are end-stage degenerative changes of the right hip. The patient is status post total hip arthroplasty on the left. There is diffuse osteopenia. IMPRESSION: 1. Acute displaced fracture of the proximal right femur as detailed above. 2. End-stage degenerative changes of the right hip. 3. Osteopenia. 4. Status post total hip arthroplasty on the left. Electronically Signed   By: Constance Holster M.D.   On: 01/30/2020 23:42   DG FEMUR, MIN 2 VIEWS RIGHT  Result Date: 01/31/2020 CLINICAL DATA:  Pain EXAM: RIGHT FEMUR 2 VIEWS COMPARISON:  01/30/2020 FINDINGS: The patient is status post prior placement of an intramedullary nail through the right femur. The osseous alignment is significantly improved. There are expected postsurgical changes including subcutaneous gas and overlying soft tissue edema. Again noted are end-stage degenerative  changes of the right hip. There are vascular calcifications throughout the right lower extremity. IMPRESSION: 1. Improved osseous alignment status post intramedullary nail placement through the right femur. 2. End-stage degenerative changes of the right hip. Electronically Signed   By:  Constance Holster M.D.   On: 01/31/2020 18:41    Assessment/Plan: 1 Day Post-Op   Principal Problem:   Closed right hip fracture (HCC) Active Problems:   Essential hypertension   Primary Parkinsonism (HCC)   S/P placement of cardiac pacemaker   Laceration of scalp   Accidental fall   Preoperative clearance   Up with therapy Confusion is improving. HGB 7.0 Post-op anemia, transfuse per primary medical team based on next lab draw. Would hold Lovenox if possible. Discharge to SNF when medically stable   Lovell Sheehan , MD 02/01/2020, 7:44 PM

## 2020-02-01 NOTE — Progress Notes (Signed)
PROGRESS NOTE    COBIE LEIDNER  ALP:[id number] DOB: 25-Jan-1932 DOA: 01/30/2020 PCP: Kirk Ruths, MD    Assessment & Plan:   Principal Problem:   Closed right hip fracture [facility]) Active Problems:   Essential hypertension   Primary Parkinsonism ([place])   S/P placement of cardiac pacemaker   Laceration of scalp   Accidental fall   Preoperative clearance    LAPORSCHE HOEGER is a 86 y.o. Caucasian female with medical history significant for hypertension, pacemaker placement 2010 for tachybradycardia syndrome and Parkinson's who presented to the emergency room following a mechanical fall.   Closed right hip fracture (HCC)   Accidental fall S/p INTRAMEDULLARY (IM) NAIL INTERTROCHANTRIC on 8/6 with Dr. Mack Guise -Extensive imaging with only injury being right hip fracture.  Head and C-spine CT negative for acute injury. PLAN: --Pain control with PRN IV morphine and Norco --schedule robaxin  --PT (pt and family want to defer until tomorrow)    Laceration of scalp secondary to fall -Wound care  Left lower quadrant pain on examination -CT abdomen/pelvis on presentation showed "no obvious solid organ injury or hematoma" and "Gallbladder distention with small stones. No evidence of cholecystitis." --monitor for now    Essential hypertension -BP labile, likely due to pain.  Not on BP at home. --IV hydralazine PRN    Primary Parkinsonism (HCC) -Continue Sinemet    S/P placement of cardiac pacemaker -No acute disease suspected at this time -Follows with Dr. Saralyn Pilar  GERD --continue home PPI  Anxiety and depression --Pt kept feeling like she was falling while lying in bed. PLAN: --continue home Buspar and Remeron --continue home Serax 10 mg BID PRN  Underweight --Ensure BID   DVT prophylaxis: Lovenox SQ Code Status: Full code  Family Communication: son and daughter-in-law updated at bedside today Status is: inpatient Dispo:   The patient is  from: SNF Anticipated d/c is to: SNF Anticipated d/c date is: 2-3 days Patient currently is not medically stable to d/c due to: just had OR surgery for hip fracture, needs PT eval (deferred today due to severe anxiety about falling)   Subjective and Interval History:  Hip pain improved after surgical fix, however, having a lot of muscle spasms.  Pt also having a lot of anxiety about falling, and felt like she was falling.     Objective: Vitals:   01/31/20 2145 02/01/20 0001 02/01/20 0427 02/01/20 0815  BP: 131/65 (!) 143/93 135/68 (!) 142/64  Pulse: 71 65 (!) 107 (!) 102  Resp: 17 19 20 19   Temp: 98.2 F (36.8 C) 98 F (36.7 C)  98 F (36.7 C)  TempSrc: Oral Oral  Oral  SpO2: 100% 100% 93% 90%  Weight:      Height:        Intake/Output Summary (Last 24 hours) at 02/01/2020 1431 Last data filed at 02/01/2020 1028 Gross per 24 hour  Intake 2492.5 ml  Output 550 ml  Net 1942.5 ml   Filed Weights   01/31/20 1410  Weight: 50.8 kg    Examination:   Constitutional: In mild distress from feeling like she was falling, alert, more lucid, holding on to son's hand the whole time HEENT: conjunctivae and lids normal, EOMI CV: RRR no M,R,G. Distal pulses +2.  No cyanosis.   RESP: CTA B/L, normal respiratory effort  GI: +BS, NTND, soft SKIN: warm, dry and intact Neuro: II - XII grossly intact.  Sensation intact Psych: anxious mood and affect.      Data  Reviewed: I have personally reviewed following labs and imaging studies  CBC: Recent Labs  Lab 01/30/20 2346 01/31/20 0743 02/01/20 0614  WBC 16.0* 13.4* 13.3*  NEUTROABS 13.3*  --   --   HGB 11.9* 9.8* 7.0*  HCT 35.0* 28.9* 20.9*  MCV 89.5 90.6 91.7  PLT 233 195 465*   Basic Metabolic Panel: Recent Labs  Lab 01/30/20 2346 02/01/20 0614  NA 131* 132*  K 4.0 3.9  CL 94* 99  CO2 25 23  GLUCOSE 132* 165*  BUN 25* 19  CREATININE 0.91 0.74  CALCIUM 9.0 8.4*  MG  --  2.0   GFR: Estimated Creatinine Clearance:  40.5 mL/min (by C-G formula based on SCr of 0.74 mg/dL). Liver Function Tests: Recent Labs  Lab 01/30/20 2346  AST 29  ALT 11  ALKPHOS 101  BILITOT 0.8  PROT 7.1  ALBUMIN 4.3   No results for input(s): LIPASE, AMYLASE in the last 168 hours. No results for input(s): AMMONIA in the last 168 hours. Coagulation Profile: Recent Labs  Lab 01/30/20 2346  INR 1.0   Cardiac Enzymes: No results for input(s): CKTOTAL, CKMB, CKMBINDEX, TROPONINI in the last 168 hours. BNP (last 3 results) No results for input(s): PROBNP in the last 8760 hours. HbA1C: No results for input(s): HGBA1C in the last 72 hours. CBG: No results for input(s): GLUCAP in the last 168 hours. Lipid Profile: No results for input(s): CHOL, HDL, LDLCALC, TRIG, CHOLHDL, LDLDIRECT in the last 72 hours. Thyroid Function Tests: No results for input(s): TSH, T4TOTAL, FREET4, T3FREE, THYROIDAB in the last 72 hours. Anemia Panel: No results for input(s): VITAMINB12, FOLATE, FERRITIN, TIBC, IRON, RETICCTPCT in the last 72 hours. Sepsis Labs: No results for input(s): PROCALCITON, LATICACIDVEN in the last 168 hours.  Recent Results (from the past 240 hour(s))  SARS Coronavirus 2 by RT PCR (hospital order, performed in [facility] hospital lab) Nasopharyngeal Nasopharyngeal Swab     Status: None   Collection Time: 01/30/20 11:46 PM   Specimen: Nasopharyngeal Swab  Result Value Ref Range Status   SARS Coronavirus 2 NEGATIVE NEGATIVE Final    Comment: (NOTE) SARS-CoV-2 target nucleic acids are NOT DETECTED.  The SARS-CoV-2 RNA is generally detectable in upper and lower respiratory specimens during the acute phase of infection. The lowest concentration of SARS-CoV-2 viral copies this assay can detect is 250 copies / mL. A negative result does not preclude SARS-CoV-2 infection and should not be used as the sole basis for treatment or other patient management decisions.  A negative result may occur with improper specimen  collection / handling, submission of specimen other than nasopharyngeal swab, presence of viral mutation(s) within the areas targeted by this assay, and inadequate number of viral copies (<250 copies / mL). A negative result must be combined with clinical observations, patient history, and epidemiological information.  Fact Sheet for Patients:   StrictlyIdeas.no  Fact Sheet for Healthcare Providers: BankingDealers.co.za  This test is not yet approved or  cleared by the [place] FDA and has been authorized for detection and/or diagnosis of SARS-CoV-2 by FDA under an Emergency Use Authorization (EUA).  This EUA will remain in effect (meaning this test can be used) for the duration of the COVID-19 declaration under Section 564(b)(1) of the Act, 21 U.S.C. section 360bbb-3(b)(1), unless the authorization is terminated or revoked sooner.  Performed at [facility], [address] [place], [place] [address]       Radiology Studies: CT ABDOMEN PELVIS WO CONTRAST  Result Date: 01/31/2020 CLINICAL DATA:  Left-sided abdominal pain after a fall EXAM: CT ABDOMEN AND PELVIS WITHOUT CONTRAST TECHNIQUE: Multidetector CT imaging of the abdomen and pelvis was performed following the standard protocol without IV contrast. COMPARISON:  None. FINDINGS: Lower chest: Lung bases are clear. Hepatobiliary: Unenhanced appearance of the liver is unremarkable. Gallbladder distention with small stones. No wall thickening. No bile duct dilatation. Pancreas: Unremarkable. No pancreatic ductal dilatation or surrounding inflammatory changes. Spleen: Normal in size without focal abnormality. Adrenals/Urinary Tract: No adrenal gland nodules. Pelvic right kidney. No hydronephrosis or hydroureter. Bladder is decompressed with a Foley catheter. No stones identified. Stomach/Bowel: Stomach, small bowel, and colon are not abnormally distended. Scattered stool  throughout the colon. No wall thickening is appreciated. Vascular/Lymphatic: Extensive vascular calcifications. Splenic artery aneurysm measuring 1.1 cm diameter. No significant lymphadenopathy. Reproductive: Limited visualization of the low pelvis due to streak artifact from left hip prosthesis. Other: No free air or free fluid identified in the abdomen. Musculoskeletal: Comminuted and displaced inter trochanteric fractures of the right hip with displaced lesser trochanteric fragment and varus angulation. Diffuse bone demineralization. Old left hip arthroplasty. Old appearing right rib fractures. Sclerosis in the sacrum may indicate stress or insufficiency fracture. Healing fracture suggested at the junction of S3 and S4. Degenerative changes in the spine. Evaluation of solid organs and vascular structures is limited without IV contrast material. IMPRESSION: 1. Evaluation of solid organs and vascular structures is limited without IV contrast material but no obvious solid organ injury or hematoma. 2. Acute comminuted and displaced inter trochanteric fractures of the right hip. 3. Healing fracture suggested at the junction of S3 and S4. Sclerosis in the sacral ala may indicate stress or insufficiency fracture. 4. Gallbladder distention with small stones. No evidence of cholecystitis. 5. Aortic atherosclerosis. Electronically Signed   By: Lucienne Capers M.D.   On: 01/31/2020 06:41   DG Chest 1 View  Result Date: 01/30/2020 CLINICAL DATA:  Pain status post fall EXAM: CHEST  1 VIEW COMPARISON:  July 16, 2019 FINDINGS: There is a dual chamber left-sided pacemaker in place. The lungs are clear. There is no pneumothorax. No focal infiltrate. The heart size is borderline enlarged. Aortic calcifications are noted. The distal third of the right clavicle appears lucent which may be secondary to imaging technique. IMPRESSION: No acute cardiopulmonary process. Electronically Signed   By: Constance Holster M.D.   On:  01/30/2020 23:43   CT Head Wo Contrast  Result Date: 01/30/2020 CLINICAL DATA:  Head trauma resulting from a fall. EXAM: CT HEAD WITHOUT CONTRAST CT CERVICAL SPINE WITHOUT CONTRAST TECHNIQUE: Multidetector CT imaging of the head and cervical spine was performed following the standard protocol without intravenous contrast. Multiplanar CT image reconstructions of the cervical spine were also generated. COMPARISON:  None. FINDINGS: CT HEAD FINDINGS Brain: Diffuse cerebral atrophy. Ventricular dilatation consistent with central atrophy. Low-attenuation changes in the deep white matter consistent with small vessel ischemia. No abnormal extra-axial fluid collections. No mass effect or midline shift. Gray-white matter junctions are distinct. Basal cisterns are not effaced. No acute intracranial hemorrhage. Vascular: Intracranial arterial vascular calcifications are present. Skull: The calvarium appears intact. Subcutaneous soft tissue scalp hematoma and laceration over the right posterior temporal region. Sinuses/Orbits: No acute finding. Other: None. CT CERVICAL SPINE FINDINGS Alignment: Slight anterior subluxation of C4 on C5. This is likely degenerative but ligamentous injury can have this appearance and can not be excluded entirely. Normal alignment of the posterior facet joints. C1-2 articulation appears intact. Skull  base and vertebrae: Skull base appears intact. Degenerative changes demonstrated in the temporomandibular joints. No vertebral compression deformities. No focal bone lesions. Soft tissues and spinal canal: No prevertebral soft tissue swelling. No abnormal paraspinal soft tissue infiltration. Vascular calcifications. Disc levels: Degenerative changes diffusely throughout the cervical spine with narrowed interspaces and endplate hypertrophic changes. Degenerative changes throughout the cervical facet joints. Upper chest: Scarring and pleural thickening in the lung apices. Other: None. IMPRESSION: 1. No  acute intracranial abnormalities. Chronic atrophy and small vessel ischemic changes. 2. Subcutaneous soft tissue scalp hematoma and laceration over the right posterior temporal region. 3. Slight anterior subluxation of C4 on C5 is likely degenerative but ligamentous injury can have this appearance and can not be excluded entirely. Diffuse degenerative changes throughout the cervical spine. No acute displaced fractures identified. Electronically Signed   By: Lucienne Capers M.D.   On: 01/30/2020 23:56   CT Cervical Spine Wo Contrast  Result Date: 01/30/2020 CLINICAL DATA:  Head trauma resulting from a fall. EXAM: CT HEAD WITHOUT CONTRAST CT CERVICAL SPINE WITHOUT CONTRAST TECHNIQUE: Multidetector CT imaging of the head and cervical spine was performed following the standard protocol without intravenous contrast. Multiplanar CT image reconstructions of the cervical spine were also generated. COMPARISON:  None. FINDINGS: CT HEAD FINDINGS Brain: Diffuse cerebral atrophy. Ventricular dilatation consistent with central atrophy. Low-attenuation changes in the deep white matter consistent with small vessel ischemia. No abnormal extra-axial fluid collections. No mass effect or midline shift. Gray-white matter junctions are distinct. Basal cisterns are not effaced. No acute intracranial hemorrhage. Vascular: Intracranial arterial vascular calcifications are present. Skull: The calvarium appears intact. Subcutaneous soft tissue scalp hematoma and laceration over the right posterior temporal region. Sinuses/Orbits: No acute finding. Other: None. CT CERVICAL SPINE FINDINGS Alignment: Slight anterior subluxation of C4 on C5. This is likely degenerative but ligamentous injury can have this appearance and can not be excluded entirely. Normal alignment of the posterior facet joints. C1-2 articulation appears intact. Skull base and vertebrae: Skull base appears intact. Degenerative changes demonstrated in the temporomandibular  joints. No vertebral compression deformities. No focal bone lesions. Soft tissues and spinal canal: No prevertebral soft tissue swelling. No abnormal paraspinal soft tissue infiltration. Vascular calcifications. Disc levels: Degenerative changes diffusely throughout the cervical spine with narrowed interspaces and endplate hypertrophic changes. Degenerative changes throughout the cervical facet joints. Upper chest: Scarring and pleural thickening in the lung apices. Other: None. IMPRESSION: 1. No acute intracranial abnormalities. Chronic atrophy and small vessel ischemic changes. 2. Subcutaneous soft tissue scalp hematoma and laceration over the right posterior temporal region. 3. Slight anterior subluxation of C4 on C5 is likely degenerative but ligamentous injury can have this appearance and can not be excluded entirely. Diffuse degenerative changes throughout the cervical spine. No acute displaced fractures identified. Electronically Signed   By: Lucienne Capers M.D.   On: 01/30/2020 23:56   DG HIP OPERATIVE UNILAT W OR W/O PELVIS RIGHT  Result Date: 01/31/2020 CLINICAL DATA:  Right hip fracture EXAM: OPERATIVE RIGHT HIP WITH PELVIS COMPARISON:  01/30/2020 FLUOROSCOPY TIME:  Radiation Exposure Index (as provided by the fluoroscopic device): Not available If the device does not provide the exposure index: Fluoroscopy Time:  3 minutes 5 seconds Number of Acquired Images:  4 FINDINGS: Medullary rod is noted in the right femur. Compression screw extending across the femoral neck is noted. Fracture fragments are in near anatomic alignment. Distal fixation screws are noted as well. IMPRESSION: Status post ORIF of the right femoral fracture. Electronically  Signed   By: Inez Catalina M.D.   On: 01/31/2020 16:58   DG Hip Unilat With Pelvis 2-3 Views Right  Result Date: 01/30/2020 CLINICAL DATA:  Pain status post fall EXAM: DG HIP (WITH OR WITHOUT PELVIS) 2-3V RIGHT COMPARISON:  None. FINDINGS: There is an acute  displaced intratrochanteric/subtrochanteric fracture of the proximal right femur. There is a medially displaced fracture fragment measuring approximately 2.7 cm. There are end-stage degenerative changes of the right hip. The patient is status post total hip arthroplasty on the left. There is diffuse osteopenia. IMPRESSION: 1. Acute displaced fracture of the proximal right femur as detailed above. 2. End-stage degenerative changes of the right hip. 3. Osteopenia. 4. Status post total hip arthroplasty on the left. Electronically Signed   By: Constance Holster M.D.   On: 01/30/2020 23:42   DG FEMUR, MIN 2 VIEWS RIGHT  Result Date: 01/31/2020 CLINICAL DATA:  Pain EXAM: RIGHT FEMUR 2 VIEWS COMPARISON:  01/30/2020 FINDINGS: The patient is status post prior placement of an intramedullary nail through the right femur. The osseous alignment is significantly improved. There are expected postsurgical changes including subcutaneous gas and overlying soft tissue edema. Again noted are end-stage degenerative changes of the right hip. There are vascular calcifications throughout the right lower extremity. IMPRESSION: 1. Improved osseous alignment status post intramedullary nail placement through the right femur. 2. End-stage degenerative changes of the right hip. Electronically Signed   By: Constance Holster M.D.   On: 01/31/2020 18:41     Scheduled Meds: . acetaminophen  1,000 mg Oral Q6H  . vitamin C  250 mg Oral BID  . aspirin  81 mg Oral Daily  . busPIRone  15 mg Oral Daily  . carbidopa-levodopa  0.5 tablet Oral TID  . Chlorhexidine Gluconate Cloth  6 each Topical Daily  . docusate sodium  100 mg Oral BID  . enoxaparin (LOVENOX) injection  40 mg Subcutaneous Q24H  . feeding supplement (ENSURE ENLIVE)  237 mL Oral BID BM  . ketorolac  7.5 mg Intravenous Q6H  . methocarbamol  500 mg Oral QID  . mirtazapine  7.5 mg Oral QHS  . multivitamin with minerals  1 tablet Oral Daily  . pantoprazole  40 mg Oral Daily   . senna  1 tablet Oral BID  . traMADol  50 mg Oral Q6H   Continuous Infusions:    LOS: 1 day     Enzo Bi, MD [facility] If 7PM-7AM, please contact night-coverage 02/01/2020, 2:31 PM

## 2020-02-01 NOTE — Anesthesia Postprocedure Evaluation (Signed)
Anesthesia Post Note  Patient: Kara Levine  Procedure(s) Performed: INTRAMEDULLARY (IM) NAIL INTERTROCHANTRIC (Right )  Patient location during evaluation: Nursing Unit Anesthesia Type: Spinal Level of consciousness: oriented and awake and alert Pain management: pain level controlled Vital Signs Assessment: post-procedure vital signs reviewed and stable Respiratory status: spontaneous breathing Cardiovascular status: blood pressure returned to baseline and stable Postop Assessment: no headache, no backache, no apparent nausea or vomiting and patient able to bend at knees Anesthetic complications: no   No complications documented.   Last Vitals:  Vitals:   02/01/20 0427 02/01/20 0815  BP: 135/68 (!) 142/64  Pulse: (!) 107 (!) 102  Resp: 20 19  Temp:  36.7 C  SpO2: 93% 90%    Last Pain:  Vitals:   02/01/20 0815  TempSrc: Oral  PainSc:                  Precious Haws Harrold Fitchett

## 2020-02-01 NOTE — Progress Notes (Signed)
PT Cancellation Note  Patient Details Name: Kara Levine MRN: [id number] DOB: [birth year]   Cancelled Treatment:    Reason Eval/Treat Not Completed: Medical issues which prohibited therapy..  Pt off some meds for surgery, possible hallucinations and will re-attempt as pt can allow.   Ramond Dial 02/01/2020, 10:25 AM   Mee Hives, PT MS Acute Rehab Dept. Number: [place] and [place]

## 2020-02-01 NOTE — Progress Notes (Signed)
PT Cancellation Note  Patient Details Name: Kara Levine MRN: [id number] DOB: 04/10/1932   Cancelled Treatment:    Reason Eval/Treat Not Completed: Other (comment).  Made another attempt and family asked PT to return tomorrow.  Follow up with them in the AM.   Ramond Dial 02/01/2020, 2:57 PM   Mee Hives, PT MS Acute Rehab Dept. Number: [place] and [place]

## 2020-02-01 NOTE — Progress Notes (Signed)
OT Cancellation Note  Patient Details Name: Kara Levine MRN: [id number] DOB: 1932-03-19   Cancelled Treatment:    Reason Eval/Treat Not Completed: Patient not medically ready  Pt with diagnosis of Parkinson's with pinning of hip - had to go off some of her meds for surgery. Hallucinating family think lying in bed- think she is falling and holding on to her son and D-I-L hands - Nsg recommend holding off- Waiting for orthro to round.   Rosalyn Gess OTR/L,CLT 02/01/2020, 10:25 AM

## 2020-02-02 LAB — BASIC METABOLIC PANEL
Anion gap: 7 (ref 5–15)
BUN: 21 mg/dL (ref 8–23)
CO2: 28 mmol/L (ref 22–32)
Calcium: 8.4 mg/dL — ABNORMAL LOW (ref 8.9–10.3)
Chloride: 101 mmol/L (ref 98–111)
Creatinine, Ser: 0.81 mg/dL (ref 0.44–1.00)
GFR calc Af Amer: >60 mL/min (ref >60)
GFR calc non Af Amer: >60 mL/min (ref >60)
Glucose, Bld: 106 mg/dL — ABNORMAL HIGH (ref 70–99)
Potassium: 4.7 mmol/L (ref 3.5–5.1)
Sodium: 136 mmol/L (ref 135–145)

## 2020-02-02 LAB — CBC
HCT: 18.9 % — ABNORMAL LOW (ref 36.0–46.0)
Hemoglobin: 6.3 g/dL — ABNORMAL LOW (ref 12.0–15.0)
MCH: 31 pg (ref 26.0–34.0)
MCHC: 33.3 g/dL (ref 30.0–36.0)
MCV: 93.1 fL (ref 80.0–100.0)
Platelets: 139 10*3/uL — ABNORMAL LOW (ref 150–400)
RBC: 2.03 MIL/uL — ABNORMAL LOW (ref 3.87–5.11)
RDW: 12.6 % (ref 11.5–15.5)
WBC: 11.9 10*3/uL — ABNORMAL HIGH (ref 4.0–10.5)
nRBC: 0 % (ref 0.0–0.2)

## 2020-02-02 LAB — MAGNESIUM: Magnesium: 2 mg/dL (ref 1.7–2.4)

## 2020-02-02 LAB — IRON AND TIBC
Iron: 25 ug/dL — ABNORMAL LOW (ref 28–170)
Saturation Ratios: 12 % (ref 10.4–31.8)
TIBC: 214 ug/dL — ABNORMAL LOW (ref 250–450)
UIBC: 189 ug/dL

## 2020-02-02 LAB — ABO/RH: ABO/RH(D): A POS

## 2020-02-02 MED ORDER — LORAZEPAM 2 MG/ML IJ SOLN
1.0000 mg | INTRAMUSCULAR | Status: DC | PRN
  Administered 2020-02-02 – 2020-02-03 (×3): 1 mg via INTRAVENOUS
  Filled 2020-02-02 (×3): qty 1

## 2020-02-02 MED ORDER — SODIUM CHLORIDE 0.9% IV SOLUTION: Freq: Once | INTRAVENOUS | Status: DC

## 2020-02-02 MED ORDER — MORPHINE SULFATE (PF) 2 MG/ML IV SOLN
2.0000 mg | INTRAVENOUS | Status: DC | PRN
  Administered 2020-02-02 – 2020-02-03 (×2): 2 mg via INTRAVENOUS
  Filled 2020-02-02 (×2): qty 1

## 2020-02-02 NOTE — Progress Notes (Signed)
PROGRESS NOTE    Kara Levine  DXI:[id number] DOB: [birth year] DOA: 01/30/2020 PCP: Kirk Ruths, MD    Assessment & Plan:   Principal Problem:   Closed right hip fracture [facility]) Active Problems:   Essential hypertension   Primary Parkinsonism ([place])   S/P placement of cardiac pacemaker   Laceration of scalp   Accidental fall   Preoperative clearance    Kara Levine is a 86 y.o. Caucasian female with medical history significant for hypertension, pacemaker placement 2010 for tachybradycardia syndrome and Parkinson's who presented to the emergency room following a mechanical fall.   DNR comfort care status --Son and daughter-in-law requested to discuss goals of care today.  Pt had told him she does not want PT and just want to be made comfortable.  Pt's mental status has been in steady decline for the past several months. Pt also doesn't want to eat or drink much.  After discussion, decision was made to make pt comfort care. PLAN: --comfort care measures --IV morphine PRN and IV ativan PRN --no visitor limitation --Will either discharge back to pt's own facility with hospice care or to hospice facility.   Closed right hip fracture (HCC)   Accidental fall S/p INTRAMEDULLARY (IM) NAIL INTERTROCHANTRIC on 8/6 with Dr. Mack Guise -Extensive imaging with only injury being right hip fracture.  Head and C-spine CT negative for acute injury. PLAN: --Pain control with PRN IV morphine and Norco --schedule robaxin  --cancel PT since pt is now comfort care     Laceration of scalp secondary to fall -Wound care  Left lower quadrant pain on examination -CT abdomen/pelvis on presentation showed "no obvious solid organ injury or hematoma" and "Gallbladder distention with small stones. No evidence of cholecystitis."    Essential hypertension -BP labile, likely due to pain.  Not on BP at home.    Primary Parkinsonism ([place]) -continue Sinemet for comfort    S/P  placement of cardiac pacemaker -No acute disease suspected at this time -Follows with Dr. Saralyn Pilar --check tomorrow to make sure it doesn't have a defibrillator.   GERD --d/c PPI  Anxiety and depression --Pt kept feeling like she was falling while lying in bed. PLAN: --d/c home Buspar and Remeron --d/c home Serax 10 mg BID PRN --IV ativan PRN  Underweight --d/c Ensure BID   DVT prophylaxis: Lovenox SQ Code Status: Full code  Family Communication: son and daughter-in-law updated at bedside today Status is: inpatient Dispo:   The patient is from: SNF Anticipated d/c is to: SNF with hospice Anticipated d/c date is: tomorrow Patient currently is medically stable to d/c.   Subjective and Interval History:  Pt remained mostly asleep.  Son and daughter-in-law requested to discuss goals of care today.  Pt had told him she does not want PT and just want to be made comfortable.  Pt's mental status has been in steady decline for the past several months.  After discussion, decision was made to make pt comfort care.   Objective: Vitals:   02/01/20 0815 02/01/20 1604 02/01/20 2337 02/02/20 0739  BP: (!) 142/64 125/61 (!) 126/58 124/74  Pulse: (!) 102 95 71 (!) 102  Resp: 19 17 14 17   Temp: 98 F (36.7 C) 98.9 F (37.2 C) 98.1 F (36.7 C) 97.9 F (36.6 C)  TempSrc: Oral Oral Oral Oral  SpO2: 90% (!) 89% 96% (!) 89%  Weight:      Height:        Intake/Output Summary (Last 24 hours) at  02/02/2020 1444 Last data filed at 02/02/2020 1357 Gross per 24 hour  Intake 0 ml  Output 400 ml  Net -400 ml   Filed Weights   01/31/20 1410  Weight: 50.8 kg    Examination:   Constitutional: In mild distress from feeling like she was falling, alert, more lucid, holding on to son's hand the whole time HEENT: conjunctivae and lids normal, EOMI CV: RRR no M,R,G. Distal pulses +2.  No cyanosis.   RESP: CTA B/L, normal respiratory effort  GI: +BS, NTND, soft SKIN: warm, dry and  intact Neuro: II - XII grossly intact.  Sensation intact Psych: anxious mood and affect.      Data Reviewed: I have personally reviewed following labs and imaging studies  CBC: Recent Labs  Lab 01/30/20 2346 01/31/20 0743 02/01/20 0614 02/02/20 0455  WBC 16.0* 13.4* 13.3* 11.9*  NEUTROABS 13.3*  --   --   --   HGB 11.9* 9.8* 7.0* 6.3*  HCT 35.0* 28.9* 20.9* 18.9*  MCV 89.5 90.6 91.7 93.1  PLT 233 195 138* 048*   Basic Metabolic Panel: Recent Labs  Lab 01/30/20 2346 02/01/20 0614 02/02/20 0455  NA 131* 132* 136  K 4.0 3.9 4.7  CL 94* 99 101  CO2 25 23 28   GLUCOSE 132* 165* 106*  BUN 25* 19 21  CREATININE 0.91 0.74 0.81  CALCIUM 9.0 8.4* 8.4*  MG  --  2.0 2.0   GFR: Estimated Creatinine Clearance: 40 mL/min (by C-G formula based on SCr of 0.81 mg/dL). Liver Function Tests: Recent Labs  Lab 01/30/20 2346  AST 29  ALT 11  ALKPHOS 101  BILITOT 0.8  PROT 7.1  ALBUMIN 4.3   No results for input(s): LIPASE, AMYLASE in the last 168 hours. No results for input(s): AMMONIA in the last 168 hours. Coagulation Profile: Recent Labs  Lab 01/30/20 2346  INR 1.0   Cardiac Enzymes: No results for input(s): CKTOTAL, CKMB, CKMBINDEX, TROPONINI in the last 168 hours. BNP (last 3 results) No results for input(s): PROBNP in the last 8760 hours. HbA1C: No results for input(s): HGBA1C in the last 72 hours. CBG: No results for input(s): GLUCAP in the last 168 hours. Lipid Profile: No results for input(s): CHOL, HDL, LDLCALC, TRIG, CHOLHDL, LDLDIRECT in the last 72 hours. Thyroid Function Tests: No results for input(s): TSH, T4TOTAL, FREET4, T3FREE, THYROIDAB in the last 72 hours. Anemia Panel: Recent Labs    02/02/20 0455  TIBC 214*  IRON 25*   Sepsis Labs: No results for input(s): PROCALCITON, LATICACIDVEN in the last 168 hours.  Recent Results (from the past 240 hour(s))  SARS Coronavirus 2 by RT PCR (hospital order, performed in [facility] hospital lab)  Nasopharyngeal Nasopharyngeal Swab     Status: None   Collection Time: 01/30/20 11:46 PM   Specimen: Nasopharyngeal Swab  Result Value Ref Range Status   SARS Coronavirus 2 NEGATIVE NEGATIVE Final    Comment: (NOTE) SARS-CoV-2 target nucleic acids are NOT DETECTED.  The SARS-CoV-2 RNA is generally detectable in upper and lower respiratory specimens during the acute phase of infection. The lowest concentration of SARS-CoV-2 viral copies this assay can detect is 250 copies / mL. A negative result does not preclude SARS-CoV-2 infection and should not be used as the sole basis for treatment or other patient management decisions.  A negative result may occur with improper specimen collection / handling, submission of specimen other than nasopharyngeal swab, presence of viral mutation(s) within the areas targeted by this  assay, and inadequate number of viral copies (<250 copies / mL). A negative result must be combined with clinical observations, patient history, and epidemiological information.  Fact Sheet for Patients:   StrictlyIdeas.no  Fact Sheet for Healthcare Providers: BankingDealers.co.za  This test is not yet approved or  cleared by the [place] FDA and has been authorized for detection and/or diagnosis of SARS-CoV-2 by FDA under an Emergency Use Authorization (EUA).  This EUA will remain in effect (meaning this test can be used) for the duration of the COVID-19 declaration under Section 564(b)(1) of the Act, 21 U.S.C. section 360bbb-3(b)(1), unless the authorization is terminated or revoked sooner.  Performed at [facility], [address] [place], [place] [address]       Radiology Studies: DG HIP OPERATIVE UNILAT W OR W/O PELVIS RIGHT  Result Date: 01/31/2020 CLINICAL DATA:  Right hip fracture EXAM: OPERATIVE RIGHT HIP WITH PELVIS COMPARISON:  01/30/2020 FLUOROSCOPY TIME:  Radiation Exposure Index (as  provided by the fluoroscopic device): Not available If the device does not provide the exposure index: Fluoroscopy Time:  3 minutes 5 seconds Number of Acquired Images:  4 FINDINGS: Medullary rod is noted in the right femur. Compression screw extending across the femoral neck is noted. Fracture fragments are in near anatomic alignment. Distal fixation screws are noted as well. IMPRESSION: Status post ORIF of the right femoral fracture. Electronically Signed   By: Inez Catalina M.D.   On: 01/31/2020 16:58   DG FEMUR, MIN 2 VIEWS RIGHT  Result Date: 01/31/2020 CLINICAL DATA:  Pain EXAM: RIGHT FEMUR 2 VIEWS COMPARISON:  01/30/2020 FINDINGS: The patient is status post prior placement of an intramedullary nail through the right femur. The osseous alignment is significantly improved. There are expected postsurgical changes including subcutaneous gas and overlying soft tissue edema. Again noted are end-stage degenerative changes of the right hip. There are vascular calcifications throughout the right lower extremity. IMPRESSION: 1. Improved osseous alignment status post intramedullary nail placement through the right femur. 2. End-stage degenerative changes of the right hip. Electronically Signed   By: Constance Holster M.D.   On: 01/31/2020 18:41     Scheduled Meds:  sodium chloride   Intravenous Once   vitamin C  250 mg Oral BID   aspirin  81 mg Oral Daily   busPIRone  15 mg Oral Daily   carbidopa-levodopa  0.5 tablet Oral TID   Chlorhexidine Gluconate Cloth  6 each Topical Daily   docusate sodium  100 mg Oral BID   feeding supplement (ENSURE ENLIVE)  237 mL Oral BID BM   methocarbamol  500 mg Oral QID   mirtazapine  7.5 mg Oral QHS   multivitamin with minerals  1 tablet Oral Daily   pantoprazole  40 mg Oral Daily   senna  1 tablet Oral BID   traMADol  50 mg Oral Q6H   Continuous Infusions:    LOS: 2 days     Enzo Bi, MD [facility] If 7PM-7AM, please contact  night-coverage 02/02/2020, 2:44 PM

## 2020-02-02 NOTE — Progress Notes (Signed)
OT Cancellation Note  Patient Details Name: Kara Levine MRN: [id number] DOB: Apr 07, 1932   Cancelled Treatment:    Reason Eval/Treat Not Completed: Patient not medically ready Pt with Hgb of 6.3 at this time. Will f/u for OT evaluation when more appropriate. Thank you.  Gerrianne Scale, [place], OTR/L ascom [phone] 02/02/20, 3:48 PM

## 2020-02-02 NOTE — Progress Notes (Signed)
PT Cancellation Note  Patient Details Name: Kara Levine MRN: [id number] DOB: 02-Jan-1932   Cancelled Treatment:    Reason Eval/Treat Not Completed: Medical issues which prohibited therapy.  Follow up as time and pt allow.   Ramond Dial 02/02/2020, 1:17 PM   Mee Hives, PT MS Acute Rehab Dept. Number: [place] and [place]

## 2020-02-02 NOTE — Progress Notes (Signed)
Subjective:  Patient reports pain as moderate.  Resting comfortably with son in room.  Objective:   VITALS:   Vitals:   02/01/20 0815 02/01/20 1604 02/01/20 2337 02/02/20 0739  BP: (!) 142/64 125/61 (!) 126/58 124/74  Pulse: (!) 102 95 71 (!) 102  Resp: 19 17 14 17   Temp: 98 F (36.7 C) 98.9 F (37.2 C) 98.1 F (36.7 C) 97.9 F (36.6 C)  TempSrc: Oral Oral Oral Oral  SpO2: 90% (!) 89% 96% (!) 89%  Weight:      Height:        PHYSICAL EXAM:  Neurovascular intact Incision: dressing C/D/I No cellulitis present Compartment soft  LABS  Results for orders placed or performed during the hospital encounter of 01/30/20 (from the past 24 hour(s))  Basic metabolic panel     Status: Abnormal   Collection Time: 02/02/20  4:55 AM  Result Value Ref Range   Sodium 136 135 - 145 mmol/L   Potassium 4.7 3.5 - 5.1 mmol/L   Chloride 101 98 - 111 mmol/L   CO2 28 22 - 32 mmol/L   Glucose, Bld 106 (H) 70 - 99 mg/dL   BUN 21 8 - 23 mg/dL   Creatinine, Ser 0.81 0.44 - 1.00 mg/dL   Calcium 8.4 (L) 8.9 - 10.3 mg/dL   GFR calc non Af Amer >60 >60 mL/min   GFR calc Af Amer >60 >60 mL/min   Anion gap 7 5 - 15  CBC     Status: Abnormal   Collection Time: 02/02/20  4:55 AM  Result Value Ref Range   WBC 11.9 (H) 4.0 - 10.5 K/uL   RBC 2.03 (L) 3.87 - 5.11 MIL/uL   Hemoglobin 6.3 (L) 12.0 - 15.0 g/dL   HCT 18.9 (L) 36 - 46 %   MCV 93.1 80.0 - 100.0 fL   MCH 31.0 26.0 - 34.0 pg   MCHC 33.3 30.0 - 36.0 g/dL   RDW 12.6 11.5 - 15.5 %   Platelets 139 (L) 150 - 400 K/uL   nRBC 0.0 0.0 - 0.2 %  Magnesium     Status: None   Collection Time: 02/02/20  4:55 AM  Result Value Ref Range   Magnesium 2.0 1.7 - 2.4 mg/dL  Iron and TIBC     Status: Abnormal   Collection Time: 02/02/20  4:55 AM  Result Value Ref Range   Iron 25 (L) 28 - 170 ug/dL   TIBC 214 (L) 250 - 450 ug/dL   Saturation Ratios 12 10.4 - 31.8 %   UIBC 189 ug/dL  ABO/Rh     Status: None   Collection Time: 02/02/20  4:55 AM   Result Value Ref Range   ABO/RH(D)      A POS Performed at [facility], [address] [place], [place] [address]   Prepare RBC (crossmatch)     Status: None   Collection Time: 02/02/20  9:09 AM  Result Value Ref Range   Order Confirmation      ORDER PROCESSED BY BLOOD BANK Performed at [facility], [address] [place], [place] [address]     DG HIP OPERATIVE UNILAT W OR W/O PELVIS RIGHT  Result Date: 01/31/2020 CLINICAL DATA:  Right hip fracture EXAM: OPERATIVE RIGHT HIP WITH PELVIS COMPARISON:  01/30/2020 FLUOROSCOPY TIME:  Radiation Exposure Index (as provided by the fluoroscopic device): Not available If the device does not provide the exposure index: Fluoroscopy Time:  3 minutes 5 seconds Number of  Acquired Images:  4 FINDINGS: Medullary rod is noted in the right femur. Compression screw extending across the femoral neck is noted. Fracture fragments are in near anatomic alignment. Distal fixation screws are noted as well. IMPRESSION: Status post ORIF of the right femoral fracture. Electronically Signed   By: Inez Catalina M.D.   On: 01/31/2020 16:58   DG FEMUR, MIN 2 VIEWS RIGHT  Result Date: 01/31/2020 CLINICAL DATA:  Pain EXAM: RIGHT FEMUR 2 VIEWS COMPARISON:  01/30/2020 FINDINGS: The patient is status post prior placement of an intramedullary nail through the right femur. The osseous alignment is significantly improved. There are expected postsurgical changes including subcutaneous gas and overlying soft tissue edema. Again noted are end-stage degenerative changes of the right hip. There are vascular calcifications throughout the right lower extremity. IMPRESSION: 1. Improved osseous alignment status post intramedullary nail placement through the right femur. 2. End-stage degenerative changes of the right hip. Electronically Signed   By: Constance Holster M.D.   On: 01/31/2020 18:41    Assessment/Plan: 2 Days Post-Op   Principal Problem:   Closed right hip  fracture [facility]) Active Problems:   Essential hypertension   Primary Parkinsonism (HCC)   S/P placement of cardiac pacemaker   Laceration of scalp   Accidental fall   Preoperative clearance   Up with therapy when able Family unsure about transfusion, as they are considering palliative care    Lovell Sheehan , MD 02/02/2020, 3:24 PM

## 2020-02-02 NOTE — Progress Notes (Signed)
PT Cancellation Note  Patient Details Name: Kara Levine MRN: [id number] DOB: [birth year]   Cancelled Treatment:    Reason Eval/Treat Not Completed: Medical issues which prohibited therapy.  New Hgb result 6.3 and will await MD instructions to address this for further attempts to move pt.   Ramond Dial 02/02/2020, 10:30 AM   Mee Hives, PT MS Acute Rehab Dept. Number: [place] and [place]

## 2020-02-03 ENCOUNTER — Encounter: Payer: Self-pay | Admitting: Orthopedic Surgery

## 2020-02-03 LAB — SARS CORONAVIRUS 2 BY RT PCR (HOSPITAL ORDER, PERFORMED IN ~~LOC~~ HOSPITAL LAB): SARS Coronavirus 2: NEGATIVE

## 2020-02-03 MED ORDER — OXYCODONE HCL 5 MG PO TABS: 5.0000 mg | ORAL_TABLET | ORAL | 0 refills | Status: AC | PRN

## 2020-02-03 MED ORDER — CHLORHEXIDINE GLUCONATE CLOTH 2 % EX PADS
6.0000 | MEDICATED_PAD | Freq: Every day | CUTANEOUS | Status: DC
  Administered 2020-02-03: 6 via TOPICAL

## 2020-02-03 MED ORDER — MORPHINE SULFATE (PF) 2 MG/ML IV SOLN: 2.0000 mg | INTRAVENOUS | 0 refills | Status: AC | PRN

## 2020-02-03 MED ORDER — LORAZEPAM 2 MG/ML IJ SOLN: 1.0000 mg | INTRAMUSCULAR | 0 refills | Status: AC | PRN

## 2020-02-03 NOTE — Progress Notes (Signed)
  Subjective:  Patient on comfort care.  I spoke with family at the bedside.    Objective:   VITALS:   Vitals:   02/01/20 2337 02/02/20 0739 02/03/20 0450 02/03/20 0739  BP: (!) 126/58 124/74 136/67 (!) 152/88  Pulse: 71 (!) 102 88 98  Resp: 14 17 19 18   Temp: 98.1 F (36.7 C) 97.9 F (36.6 C) 98.2 F (36.8 C) 97.9 F (36.6 C)  TempSrc: Oral Oral Oral Oral  SpO2: 96% (!) 89% 93% 95%  Weight:      Height:        PHYSICAL EXAM: Patient sleeping comfortably.  LABS  Results for orders placed or performed during the hospital encounter of 01/30/20 (from the past 24 hour(s))  SARS Coronavirus 2 by RT PCR (hospital order, performed in [facility] hospital lab) Nasopharyngeal Nasopharyngeal Swab     Status: None   Collection Time: 02/03/20  1:26 PM   Specimen: Nasopharyngeal Swab  Result Value Ref Range   SARS Coronavirus 2 NEGATIVE NEGATIVE    No results found.  Assessment/Plan: 3 Days Post-Op   Principal Problem:   Closed right hip fracture (HCC) Active Problems:   Essential hypertension   Primary Parkinsonism (HCC)   S/P placement of cardiac pacemaker   Laceration of scalp   Accidental fall   Preoperative clearance  Comfort care.      Th[place]ton [place] , MD 02/03/2020, 2:29 PM

## 2020-02-03 NOTE — Progress Notes (Signed)
Order was not placed at the time of MEWS due to waiting for transitioning on comfort measure.  Per MD, pt is on comfort care now.

## 2020-02-03 NOTE — Discharge Summary (Signed)
Physician Discharge Summary   Kara Levine  female DOB: May 20, 1932  KVQ:[id number]  PCP: Kirk Ruths, MD  Admit date: 01/30/2020 Discharge date: 02/03/2020  Admitted From: SNF Disposition:  SNF with hospice Daughter-in-law updated prior to discharge.  CODE STATUS: DNR Comfort care measures   Discharge Instructions    No wound care   Complete by: As directed        Hospital Course:  For full details, please see H&P, progress notes, consult notes and ancillary notes.  Briefly,  Kara Levine a 86 y.o.Caucasian femalewith medical history significant forhypertension, pacemaker placement 2010 for tachybradycardia syndrome and Parkinson's who presented from SNF (long-term resident) to the emergency room following a mechanical fall.   DNR comfort care status Son and daughter-in-law requested to discuss goals of care 8/8.  Pt had told him she does not want PT and just want to be made comfortable.  Pt's mental status has been in steady decline for the past several months. Pt also doesn't want to eat or drink much.  After discussion, decision was made to make pt comfort care.  IV morphine PRN and IV ativan PRN ordered.  Hospice liaison discussed with family.  Pt was discharged back to own facility with hospice care.  Foley cath inserted prior to discharge for comfort.  Closed right hip fracture (HCC) Accidental fall S/p INTRAMEDULLARY (IM) NAIL INTERTROCHANTRIC on 8/6 with Dr. Mack Guise Extensive imaging with only injury being right hip fracture. Head and C-spine CT negative for acute injury.  Pt had less pain after the surgery, but could not tolerate any PT due to extreme fear of falling again.  Pt and family had chosen comfort care and hospice.  Laceration of scalpsecondary to fall No wound care needed.  Left lower quadrant pain on examination CT abdomen/pelvis on presentation showed "no obvious solid organ injury or hematoma" and "Gallbladder  distention with small stones. No evidence of cholecystitis."  Pt not complaining of abdominal pain.  Essential hypertension BP labile, likely due to pain.  Not on BP meds at home.  Primary Parkinsonism ([place]) Sinemet d/c'ed due to comfort care status.  S/P placement of cardiac pacemaker No acute disease suspected at this time.  Follows with Dr.paraschos  GERD d/c PPI due to comfort care measures.  Anxiety and depression Pt kept feeling like she was falling even while lying in bed.  Home Buspar, Remeron and Serax d/c'ed due to comfort care measures.  IV ativan PRN.  Underweight    Discharge Diagnoses:  Principal Problem:   Closed right hip fracture [facility]) Active Problems:   Essential hypertension   Primary Parkinsonism (HCC)   S/P placement of cardiac pacemaker   Laceration of scalp   Accidental fall   Preoperative clearance    Discharge Instructions:  Allergies as of 02/03/2020      Reactions   Codeine    Keflex [cephalexin]    Norco [hydrocodone-acetaminophen]    Shellfish Allergy Nausea Only   Patient stated it was like food poison       Medication List    STOP taking these medications   acetaminophen 325 MG tablet Commonly known as: TYLENOL   aspirin 81 MG chewable tablet   AZO Cranberry Urinary Tract 250-60 MG Caps Generic drug: Cranberry-Vitamin C   busPIRone 15 MG tablet Commonly known as: BUSPAR   carbidopa-levodopa 25-100 MG tablet Commonly known as: SINEMET IR   hydrALAZINE 25 MG tablet Commonly known as: APRESOLINE   loratadine 10 MG tablet Commonly  known as: CLARITIN   mirtazapine 7.5 MG tablet Commonly known as: REMERON   omeprazole 20 MG capsule Commonly known as: PRILOSEC   triamcinolone cream 0.1 % Commonly known as: KENALOG     TAKE these medications   LORazepam 2 MG/ML injection Commonly known as: ATIVAN Inject 0.5 mLs (1 mg total) into the vein every 2 (two) hours as needed for anxiety or sedation.   morphine  2 MG/ML injection Inject 1 mL (2 mg total) into the vein every 2 (two) hours as needed.   oxyCODONE 5 MG immediate release tablet Commonly known as: Oxy IR/ROXICODONE Take 1-2 tablets (5-10 mg total) by mouth every 4 (four) hours as needed for moderate pain (pain score 4-6).         Allergies  Allergen Reactions  . Codeine   . Keflex [Cephalexin]   . Norco [Hydrocodone-Acetaminophen]   . Shellfish Allergy Nausea Only    Patient stated it was like food poison      The results of significant diagnostics from this hospitalization (including imaging, microbiology, ancillary and laboratory) are listed below for reference.   Consultations:   Procedures/Studies: CT ABDOMEN PELVIS WO CONTRAST  Result Date: 01/31/2020 CLINICAL DATA:  Left-sided abdominal pain after a fall EXAM: CT ABDOMEN AND PELVIS WITHOUT CONTRAST TECHNIQUE: Multidetector CT imaging of the abdomen and pelvis was performed following the standard protocol without IV contrast. COMPARISON:  None. FINDINGS: Lower chest: Lung bases are clear. Hepatobiliary: Unenhanced appearance of the liver is unremarkable. Gallbladder distention with small stones. No wall thickening. No bile duct dilatation. Pancreas: Unremarkable. No pancreatic ductal dilatation or surrounding inflammatory changes. Spleen: Normal in size without focal abnormality. Adrenals/Urinary Tract: No adrenal gland nodules. Pelvic right kidney. No hydronephrosis or hydroureter. Bladder is decompressed with a Foley catheter. No stones identified. Stomach/Bowel: Stomach, small bowel, and colon are not abnormally distended. Scattered stool throughout the colon. No wall thickening is appreciated. Vascular/Lymphatic: Extensive vascular calcifications. Splenic artery aneurysm measuring 1.1 cm diameter. No significant lymphadenopathy. Reproductive: Limited visualization of the low pelvis due to streak artifact from left hip prosthesis. Other: No free air or free fluid identified in  the abdomen. Musculoskeletal: Comminuted and displaced inter trochanteric fractures of the right hip with displaced lesser trochanteric fragment and varus angulation. Diffuse bone demineralization. Old left hip arthroplasty. Old appearing right rib fractures. Sclerosis in the sacrum may indicate stress or insufficiency fracture. Healing fracture suggested at the junction of S3 and S4. Degenerative changes in the spine. Evaluation of solid organs and vascular structures is limited without IV contrast material. IMPRESSION: 1. Evaluation of solid organs and vascular structures is limited without IV contrast material but no obvious solid organ injury or hematoma. 2. Acute comminuted and displaced inter trochanteric fractures of the right hip. 3. Healing fracture suggested at the junction of S3 and S4. Sclerosis in the sacral ala may indicate stress or insufficiency fracture. 4. Gallbladder distention with small stones. No evidence of cholecystitis. 5. Aortic atherosclerosis. Electronically Signed   By: Lucienne Capers M.D.   On: 01/31/2020 06:41   DG Chest 1 View  Result Date: 01/30/2020 CLINICAL DATA:  Pain status post fall EXAM: CHEST  1 VIEW COMPARISON:  July 16, 2019 FINDINGS: There is a dual chamber left-sided pacemaker in place. The lungs are clear. There is no pneumothorax. No focal infiltrate. The heart size is borderline enlarged. Aortic calcifications are noted. The distal third of the right clavicle appears lucent which may be secondary to imaging technique. IMPRESSION: No acute  cardiopulmonary process. Electronically Signed   By: Constance Holster M.D.   On: 01/30/2020 23:43   CT Head Wo Contrast  Result Date: 01/30/2020 CLINICAL DATA:  Head trauma resulting from a fall. EXAM: CT HEAD WITHOUT CONTRAST CT CERVICAL SPINE WITHOUT CONTRAST TECHNIQUE: Multidetector CT imaging of the head and cervical spine was performed following the standard protocol without intravenous contrast. Multiplanar CT image  reconstructions of the cervical spine were also generated. COMPARISON:  None. FINDINGS: CT HEAD FINDINGS Brain: Diffuse cerebral atrophy. Ventricular dilatation consistent with central atrophy. Low-attenuation changes in the deep white matter consistent with small vessel ischemia. No abnormal extra-axial fluid collections. No mass effect or midline shift. Gray-white matter junctions are distinct. Basal cisterns are not effaced. No acute intracranial hemorrhage. Vascular: Intracranial arterial vascular calcifications are present. Skull: The calvarium appears intact. Subcutaneous soft tissue scalp hematoma and laceration over the right posterior temporal region. Sinuses/Orbits: No acute finding. Other: None. CT CERVICAL SPINE FINDINGS Alignment: Slight anterior subluxation of C4 on C5. This is likely degenerative but ligamentous injury can have this appearance and can not be excluded entirely. Normal alignment of the posterior facet joints. C1-2 articulation appears intact. Skull base and vertebrae: Skull base appears intact. Degenerative changes demonstrated in the temporomandibular joints. No vertebral compression deformities. No focal bone lesions. Soft tissues and spinal canal: No prevertebral soft tissue swelling. No abnormal paraspinal soft tissue infiltration. Vascular calcifications. Disc levels: Degenerative changes diffusely throughout the cervical spine with narrowed interspaces and endplate hypertrophic changes. Degenerative changes throughout the cervical facet joints. Upper chest: Scarring and pleural thickening in the lung apices. Other: None. IMPRESSION: 1. No acute intracranial abnormalities. Chronic atrophy and small vessel ischemic changes. 2. Subcutaneous soft tissue scalp hematoma and laceration over the right posterior temporal region. 3. Slight anterior subluxation of C4 on C5 is likely degenerative but ligamentous injury can have this appearance and can not be excluded entirely. Diffuse  degenerative changes throughout the cervical spine. No acute displaced fractures identified. Electronically Signed   By: Lucienne Capers M.D.   On: 01/30/2020 23:56   CT Cervical Spine Wo Contrast  Result Date: 01/30/2020 CLINICAL DATA:  Head trauma resulting from a fall. EXAM: CT HEAD WITHOUT CONTRAST CT CERVICAL SPINE WITHOUT CONTRAST TECHNIQUE: Multidetector CT imaging of the head and cervical spine was performed following the standard protocol without intravenous contrast. Multiplanar CT image reconstructions of the cervical spine were also generated. COMPARISON:  None. FINDINGS: CT HEAD FINDINGS Brain: Diffuse cerebral atrophy. Ventricular dilatation consistent with central atrophy. Low-attenuation changes in the deep white matter consistent with small vessel ischemia. No abnormal extra-axial fluid collections. No mass effect or midline shift. Gray-white matter junctions are distinct. Basal cisterns are not effaced. No acute intracranial hemorrhage. Vascular: Intracranial arterial vascular calcifications are present. Skull: The calvarium appears intact. Subcutaneous soft tissue scalp hematoma and laceration over the right posterior temporal region. Sinuses/Orbits: No acute finding. Other: None. CT CERVICAL SPINE FINDINGS Alignment: Slight anterior subluxation of C4 on C5. This is likely degenerative but ligamentous injury can have this appearance and can not be excluded entirely. Normal alignment of the posterior facet joints. C1-2 articulation appears intact. Skull base and vertebrae: Skull base appears intact. Degenerative changes demonstrated in the temporomandibular joints. No vertebral compression deformities. No focal bone lesions. Soft tissues and spinal canal: No prevertebral soft tissue swelling. No abnormal paraspinal soft tissue infiltration. Vascular calcifications. Disc levels: Degenerative changes diffusely throughout the cervical spine with narrowed interspaces and endplate hypertrophic  changes. Degenerative changes  throughout the cervical facet joints. Upper chest: Scarring and pleural thickening in the lung apices. Other: None. IMPRESSION: 1. No acute intracranial abnormalities. Chronic atrophy and small vessel ischemic changes. 2. Subcutaneous soft tissue scalp hematoma and laceration over the right posterior temporal region. 3. Slight anterior subluxation of C4 on C5 is likely degenerative but ligamentous injury can have this appearance and can not be excluded entirely. Diffuse degenerative changes throughout the cervical spine. No acute displaced fractures identified. Electronically Signed   By: Lucienne Capers M.D.   On: 01/30/2020 23:56   DG HIP OPERATIVE UNILAT W OR W/O PELVIS RIGHT  Result Date: 01/31/2020 CLINICAL DATA:  Right hip fracture EXAM: OPERATIVE RIGHT HIP WITH PELVIS COMPARISON:  01/30/2020 FLUOROSCOPY TIME:  Radiation Exposure Index (as provided by the fluoroscopic device): Not available If the device does not provide the exposure index: Fluoroscopy Time:  3 minutes 5 seconds Number of Acquired Images:  4 FINDINGS: Medullary rod is noted in the right femur. Compression screw extending across the femoral neck is noted. Fracture fragments are in near anatomic alignment. Distal fixation screws are noted as well. IMPRESSION: Status post ORIF of the right femoral fracture. Electronically Signed   By: Inez Catalina M.D.   On: 01/31/2020 16:58   DG Hip Unilat With Pelvis 2-3 Views Right  Result Date: 01/30/2020 CLINICAL DATA:  Pain status post fall EXAM: DG HIP (WITH OR WITHOUT PELVIS) 2-3V RIGHT COMPARISON:  None. FINDINGS: There is an acute displaced intratrochanteric/subtrochanteric fracture of the proximal right femur. There is a medially displaced fracture fragment measuring approximately 2.7 cm. There are end-stage degenerative changes of the right hip. The patient is status post total hip arthroplasty on the left. There is diffuse osteopenia. IMPRESSION: 1. Acute  displaced fracture of the proximal right femur as detailed above. 2. End-stage degenerative changes of the right hip. 3. Osteopenia. 4. Status post total hip arthroplasty on the left. Electronically Signed   By: Constance Holster M.D.   On: 01/30/2020 23:42   DG FEMUR, MIN 2 VIEWS RIGHT  Result Date: 01/31/2020 CLINICAL DATA:  Pain EXAM: RIGHT FEMUR 2 VIEWS COMPARISON:  01/30/2020 FINDINGS: The patient is status post prior placement of an intramedullary nail through the right femur. The osseous alignment is significantly improved. There are expected postsurgical changes including subcutaneous gas and overlying soft tissue edema. Again noted are end-stage degenerative changes of the right hip. There are vascular calcifications throughout the right lower extremity. IMPRESSION: 1. Improved osseous alignment status post intramedullary nail placement through the right femur. 2. End-stage degenerative changes of the right hip. Electronically Signed   By: Constance Holster M.D.   On: 01/31/2020 18:41      Labs: BNP (last 3 results) Recent Labs    05/17/19 1540 07/16/19 1513  BNP 291.0* 160.1*   Basic Metabolic Panel: Recent Labs  Lab 01/30/20 2346 02/01/20 0614 02/02/20 0455  NA 131* 132* 136  K 4.0 3.9 4.7  CL 94* 99 101  CO2 25 23 28   GLUCOSE 132* 165* 106*  BUN 25* 19 21  CREATININE 0.91 0.74 0.81  CALCIUM 9.0 8.4* 8.4*  MG  --  2.0 2.0   Liver Function Tests: Recent Labs  Lab 01/30/20 2346  AST 29  ALT 11  ALKPHOS 101  BILITOT 0.8  PROT 7.1  ALBUMIN 4.3   No results for input(s): LIPASE, AMYLASE in the last 168 hours. No results for input(s): AMMONIA in the last 168 hours. CBC: Recent Labs  Lab 01/30/20  2346 01/31/20 0743 02/01/20 0614 02/02/20 0455  WBC 16.0* 13.4* 13.3* 11.9*  NEUTROABS 13.3*  --   --   --   HGB 11.9* 9.8* 7.0* 6.3*  HCT 35.0* 28.9* 20.9* 18.9*  MCV 89.5 90.6 91.7 93.1  PLT 233 195 138* 139*   Cardiac Enzymes: No results for input(s):  CKTOTAL, CKMB, CKMBINDEX, TROPONINI in the last 168 hours. BNP: Invalid input(s): POCBNP CBG: No results for input(s): GLUCAP in the last 168 hours. D-Dimer No results for input(s): DDIMER in the last 72 hours. Hgb A1c No results for input(s): HGBA1C in the last 72 hours. Lipid Profile No results for input(s): CHOL, HDL, LDLCALC, TRIG, CHOLHDL, LDLDIRECT in the last 72 hours. Thyroid function studies No results for input(s): TSH, T4TOTAL, T3FREE, THYROIDAB in the last 72 hours.  Invalid input(s): FREET3 Anemia work up Recent Labs    02/02/20 0455  TIBC 214*  IRON 25*   Urinalysis    Component Value Date/Time   COLORURINE YELLOW (A) 11/05/2019 0115   APPEARANCEUR CLOUDY (A) 11/05/2019 0115   APPEARANCEUR Clear 06/06/2019 1118   LABSPEC 1.012 11/05/2019 0115   PHURINE 9.0 (H) 11/05/2019 0115   GLUCOSEU NEGATIVE 11/05/2019 0115   HGBUR MODERATE (A) 11/05/2019 0115   BILIRUBINUR NEGATIVE 11/05/2019 0115   BILIRUBINUR Negative 06/06/2019 1118   KETONESUR NEGATIVE 11/05/2019 0115   PROTEINUR 100 (A) 11/05/2019 0115   NITRITE NEGATIVE 11/05/2019 0115   LEUKOCYTESUR LARGE (A) 11/05/2019 0115   Sepsis Labs Invalid input(s): PROCALCITONIN,  WBC,  LACTICIDVEN Microbiology Recent Results (from the past 240 hour(s))  SARS Coronavirus 2 by RT PCR (hospital order, performed in [place] hospital lab) Nasopharyngeal Nasopharyngeal Swab     Status: None   Collection Time: 01/30/20 11:46 PM   Specimen: Nasopharyngeal Swab  Result Value Ref Range Status   SARS Coronavirus 2 NEGATIVE NEGATIVE Final    Comment: (NOTE) SARS-CoV-2 target nucleic acids are NOT DETECTED.  The SARS-CoV-2 RNA is generally detectable in upper and lower respiratory specimens during the acute phase of infection. The lowest concentration of SARS-CoV-2 viral copies this assay can detect is 250 copies / mL. A negative result does not preclude SARS-CoV-2 infection and should not be used as the sole basis for  treatment or other patient management decisions.  A negative result may occur with improper specimen collection / handling, submission of specimen other than nasopharyngeal swab, presence of viral mutation(s) within the areas targeted by this assay, and inadequate number of viral copies (<250 copies / mL). A negative result must be combined with clinical observations, patient history, and epidemiological information.  Fact Sheet for Patients:   StrictlyIdeas.no  Fact Sheet for Healthcare Providers: BankingDealers.co.za  This test is not yet approved or  cleared by the [place] FDA and has been authorized for detection and/or diagnosis of SARS-CoV-2 by FDA under an Emergency Use Authorization (EUA).  This EUA will remain in effect (meaning this test can be used) for the duration of the COVID-19 declaration under Section 564(b)(1) of the Act, 21 U.S.C. section 360bbb-3(b)(1), unless the authorization is terminated or revoked sooner.  Performed at [facility], [address] [place], [place] [address]      Total time spend on discharging this patient, including the last patient exam, discussing the hospital stay, instructions for ongoing care as it relates to all pertinent caregivers, as well as preparing the medical discharge records, prescriptions, and/or referrals as applicable, is 45 minutes.    Enzo Bi, MD  [facility] 02/03/2020, 12:36 PM  If 7PM-7AM, please contact night-coverage

## 2020-02-03 NOTE — Progress Notes (Signed)
   02/03/20 0739  Assess: MEWS Score  Temp 97.9 F (36.6 C)  BP (!) 152/88  Pulse Rate 98  Resp 18  Level of Consciousness Responds to Pain  SpO2 95 %  O2 Device Room Air  Assess: MEWS Score  MEWS Temp 0  MEWS Systolic 0  MEWS Pulse 0  MEWS RR 0  MEWS LOC 2  MEWS Score 2  MEWS Score Color Yellow  Notify: Provider  Provider Name/Title Dr. Billie Ruddy  Date Provider Notified 02/03/20  Time Provider Notified [phone]  Notification Type Page  Notification Reason Other (Comment) (per report. treat pt as comfort care. MD will make rounds)  Response Other (Comment) (MD will make rounds regargs transitioning pt to comfort care)  Date of Provider Response 02/03/20  Time of Provider Response [phone]

## 2020-02-03 NOTE — TOC Progression Note (Signed)
Transition of Care [facility]) - Progression Note    Patient Details  Name: Kara Levine MRN: [id number] Date of Birth: December 11, 1931  [facility] CM/SW Contact  Shelbie Ammons, RN Phone Number: 02/03/2020, 10:44 AM  Clinical Narrative:   RNCM met with patient's daughter in law Santiago Glad at bedside to discuss new recommendations for [employer]. Santiago Glad reports that this is the family as well as the patient's wishes. Santiago Glad reports that they have no preferences as to which Hospice agency is used and would defer to which ever is preferred by the facility.  RNCM placed call and spoke with Joelene Millin with [facility] who reports patient can return with Hospice services and that they typically use [facility].  RNCM reached out to [facility] with [facility] and she accepted referral and will follow up for appropriateness.          Expected Discharge Plan and Services                                                 Social Determinants of Health (SDOH) Interventions    Readmission Risk Interventions No flowsheet data found.

## 2020-02-03 NOTE — Progress Notes (Signed)
Discharge note: [name] at [facility].  Reported/reviewed discharge instructions with facility nurse, [place] nurse verbalized understood. Pt transported via EMS to Kara Levine.

## 2020-02-03 NOTE — Progress Notes (Signed)
[facility] Room [place] RN note  Received request from [place] for hospice services at [facility] after discharge. Chart and patient information under review by [facility] physician. Hospice eligibility pending at this time.  Spoke with Wyvonne Lenz to initiate education related to hospice philosophy, services and to answer any questions. Santiago Glad verbalized understanding of information given. Per discussion, the plan is for discharge today by EMS.  DME needs discussed. Patient is returning to [place] facility and has no DME needs at this time. Purewick catheter discussed, this is not an item that hospice provides.  Please send signed and completed DNR with patient at discharge. Please provide prescriptions at discharge as needed to ensure ongoing symptom management.  [facility] information and contact information given to Santiago Glad. Above information shared with Jhonnie Garner with [facility] team.  Please call with any hospice related questions or concerns.  Thank you, Margaretmary Eddy, BSN, RN [facility] Liaison [phone]

## 2020-02-03 NOTE — Care Management Important Message (Signed)
Important Message  Patient Details  Name: Kara Levine MRN: [id number] Date of Birth: [birth year]   Medicare Important Message Given:  Yes     [name], [place] 02/03/2020, 1:30 PM

## 2020-02-03 NOTE — Plan of Care (Signed)
  Problem: Education: Goal: Knowledge of General Education information will improve Description Including pain rating scale, medication(s)/side effects and non-pharmacologic comfort measures Outcome: Progressing   

## 2020-02-04 DIAGNOSIS — S72009D Fracture of unspecified part of neck of unspecified femur, subsequent encounter for closed fracture with routine healing: Secondary | ICD-10-CM | POA: Diagnosis not present

## 2020-02-04 DIAGNOSIS — Z515 Encounter for palliative care: Secondary | ICD-10-CM | POA: Diagnosis not present

## 2020-02-04 DIAGNOSIS — G20 Parkinson's disease: Secondary | ICD-10-CM | POA: Diagnosis not present

## 2020-02-04 DIAGNOSIS — I509 Heart failure, unspecified: Secondary | ICD-10-CM | POA: Diagnosis not present

## 2020-02-04 LAB — TYPE AND SCREEN
ABO/RH(D): A POS
Antibody Screen: NEGATIVE
Unit division: 0

## 2020-02-04 LAB — BPAM RBC
Blood Product Expiration Date: 202108312359
Unit Type and Rh: 6200

## 2020-02-04 LAB — PREPARE RBC (CROSSMATCH)

## 2020-02-06 ENCOUNTER — Ambulatory Visit: Admission: RE | Admit: 2020-02-06 | Payer: PPO | Source: Ambulatory Visit

## 2020-02-14 DIAGNOSIS — I110 Hypertensive heart disease with heart failure: Secondary | ICD-10-CM | POA: Diagnosis not present

## 2020-02-14 DIAGNOSIS — I4891 Unspecified atrial fibrillation: Secondary | ICD-10-CM | POA: Diagnosis not present

## 2020-02-14 DIAGNOSIS — I509 Heart failure, unspecified: Secondary | ICD-10-CM | POA: Diagnosis not present

## 2020-02-14 DIAGNOSIS — F329 Major depressive disorder, single episode, unspecified: Secondary | ICD-10-CM | POA: Diagnosis not present

## 2020-02-14 DIAGNOSIS — G20 Parkinson's disease: Secondary | ICD-10-CM | POA: Diagnosis not present

## 2020-02-14 DIAGNOSIS — S72001D Fracture of unspecified part of neck of right femur, subsequent encounter for closed fracture with routine healing: Secondary | ICD-10-CM | POA: Diagnosis not present

## 2020-02-14 DIAGNOSIS — F419 Anxiety disorder, unspecified: Secondary | ICD-10-CM | POA: Diagnosis not present

## 2020-02-26 DEATH — deceased

## 2022-04-25 ENCOUNTER — Encounter (INDEPENDENT_AMBULATORY_CARE_PROVIDER_SITE_OTHER): Payer: Self-pay
